# Patient Record
Sex: Male | Born: 1980 | Race: White | Hispanic: Yes | Smoking: Never smoker
Health system: Southern US, Community
[De-identification: ages and names within clinical notes are randomized; demographics above are authoritative.]

## PROBLEM LIST (undated history)

## (undated) ENCOUNTER — Emergency Department (HOSPITAL_COMMUNITY): Payer: Self-pay

## (undated) DIAGNOSIS — E119 Type 2 diabetes mellitus without complications: Secondary | ICD-10-CM

---

## 2007-12-30 ENCOUNTER — Emergency Department (HOSPITAL_COMMUNITY): Admission: EM | Admit: 2007-12-30 | Discharge: 2007-12-30 | Payer: Self-pay | Admitting: Emergency Medicine

## 2008-12-04 IMAGING — CT CT ABDOMEN W/ CM
2 of 5 series · 17 of 46 positions shown, 19 images · IV contrast (omni 300/water & 100 ML OMNI 300)
Comparison: None

CT ABDOMEN

CLINICAL DATA: DIARRHEA.  ABDOMINAL PAIN.  INCREASED WHITE BLOOD
CELL COUNT.

CT ABDOMEN AND PELVIS WITH CONTRAST
TECHNIQUE: Multidetector CT imaging of the abdomen and pelvis was
performed following the standard protocol following the bolus
administration of intravenous contrast.
Contrast: 100 ml [id number]

[Series 2: routine abdomen · axial · 0.74mm/px · z∈[-450,-30]mm · 14 of 94 slices shown, 16 images]
[im 6/94  soft-tissue]
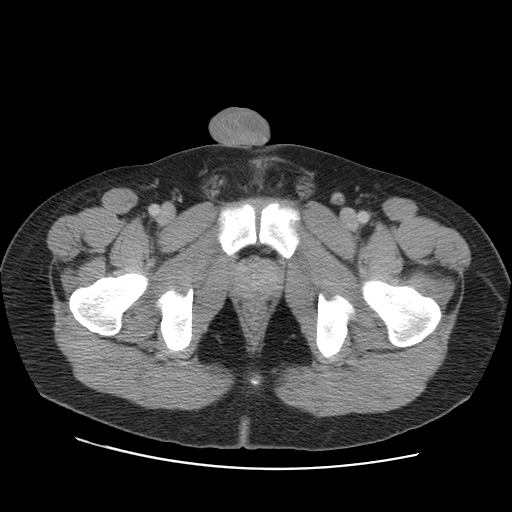
[im 6/94  bone]
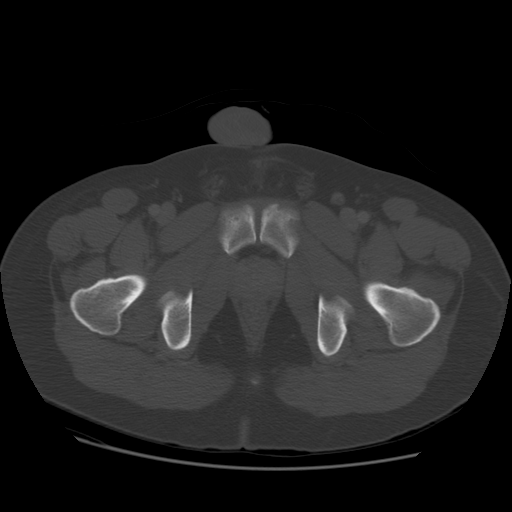
[im 11/94  soft-tissue]
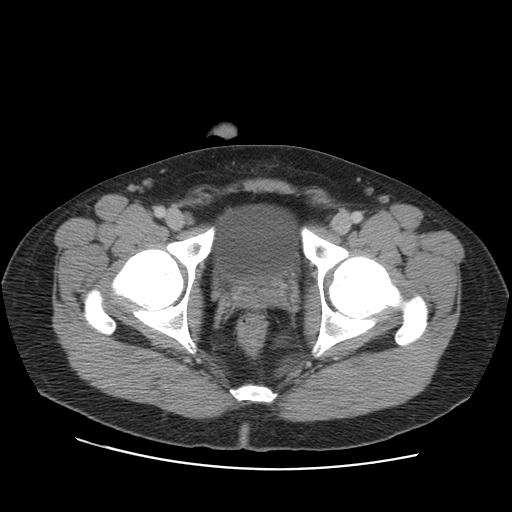
[im 21/94  soft-tissue]
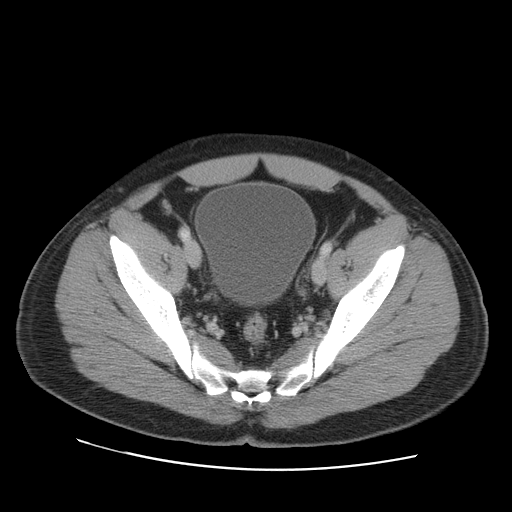
[im 26/94  soft-tissue]
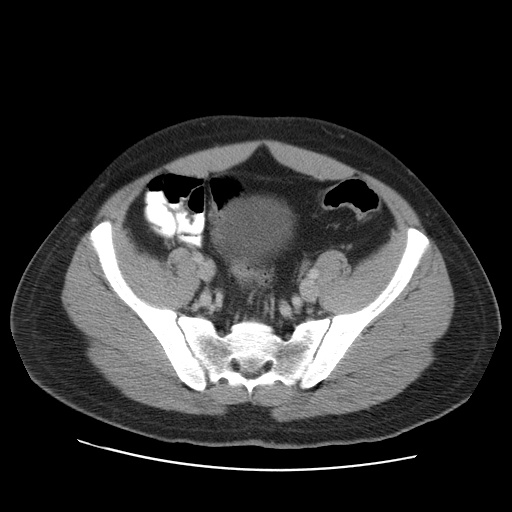
[im 32/94  soft-tissue]
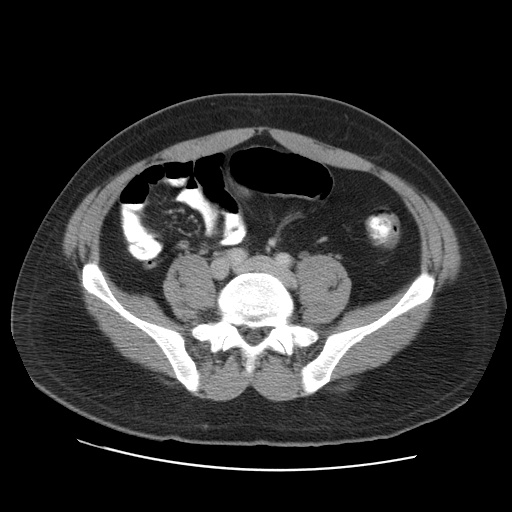
[im 37/94  soft-tissue]
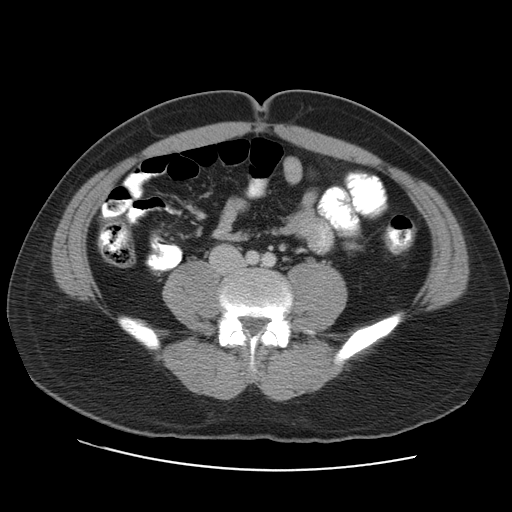
[im 42/94  soft-tissue]
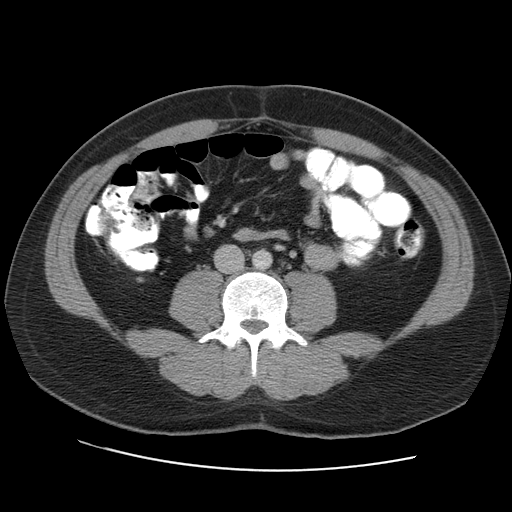
[im 52/94  soft-tissue]
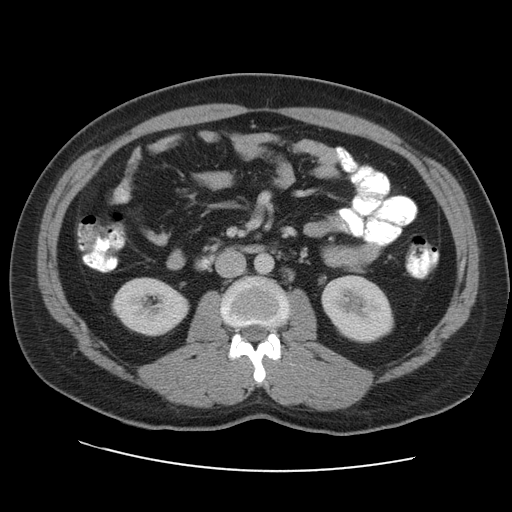
[im 57/94  soft-tissue]
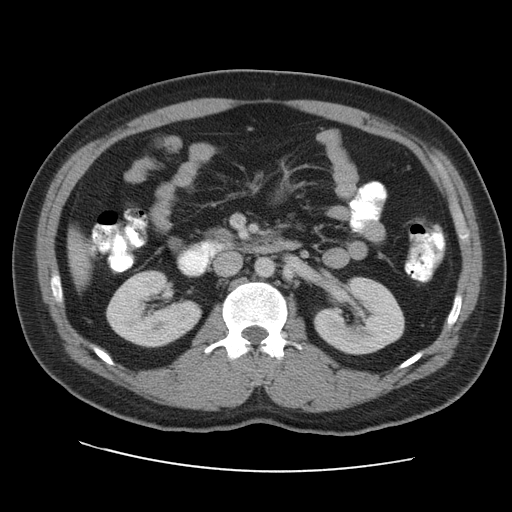
[im 57/94  bone]
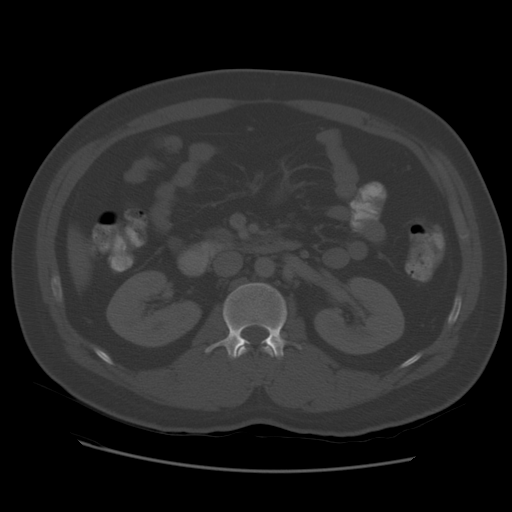
[im 63/94  soft-tissue]
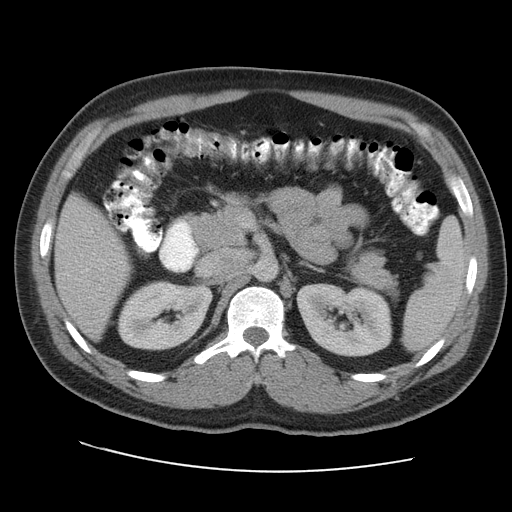
[im 68/94  soft-tissue]
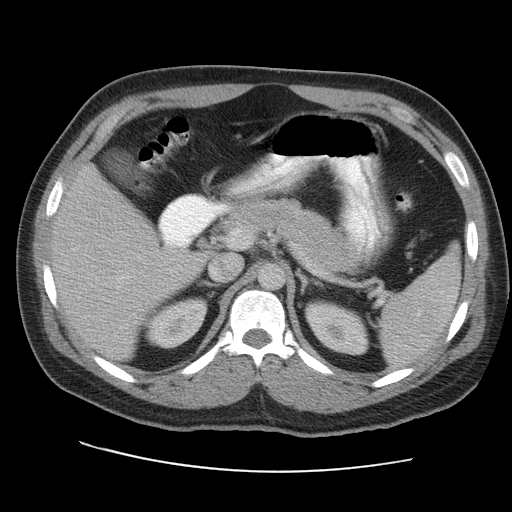
[im 73/94  soft-tissue]
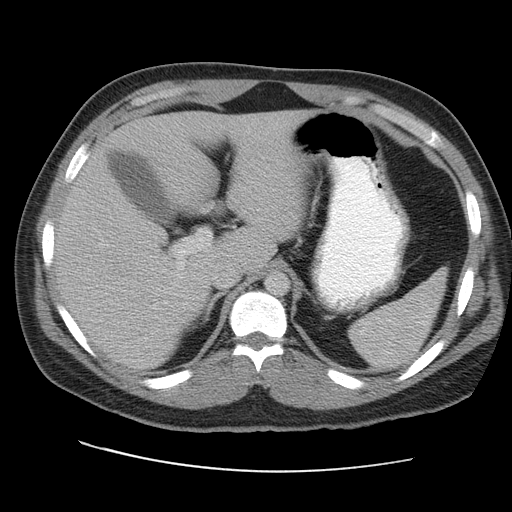
[im 83/94  soft-tissue]
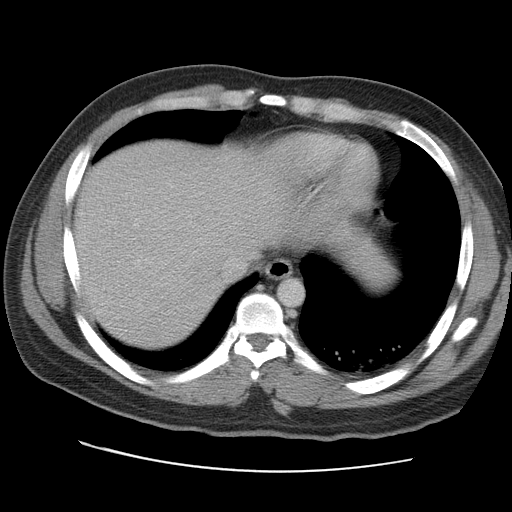
[im 88/94  soft-tissue]
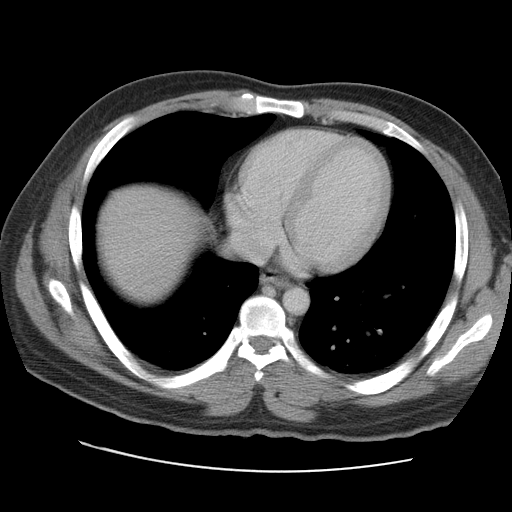

[Series 400: cor abd · coronal · 0.90mm/px · 3 of 134 slices shown]
[im 45/134  soft-tissue]
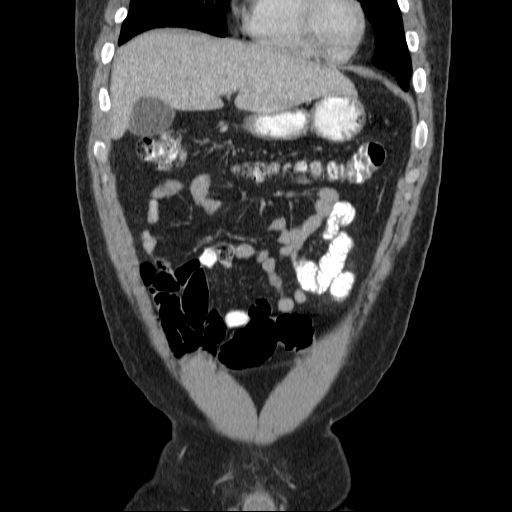
[im 60/134  soft-tissue]
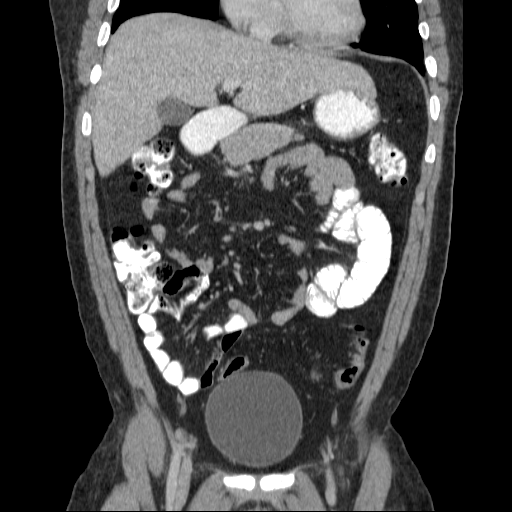
[im 74/134  soft-tissue]
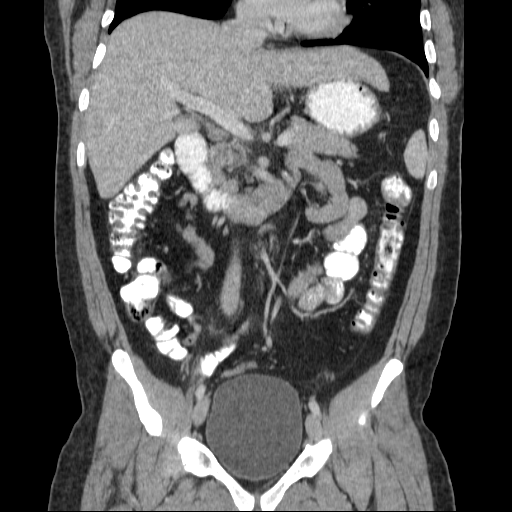

[17 of 46 positions shown; findings below may reference images not displayed]

FINDINGS: Mild bibasilar atelectasis. Normal heart size without
pericardial or pleural effusion.  Normal liver, spleen, stomach,
pancreas, gallbladder, biliary tree, adrenal glands.

Normal kidneys. No retroperitoneal or retrocrural adenopathy.
Normal colon, appendix, and terminal ileum.

Appendix normal on image 64. Normal abdominal small bowel without
ascites.
IMPRESSION: 1. No acute abdominal process.

CT PELVIS
FINDINGS: Normal pelvic bowel loops.  No pelvic adenopathy
ascites.  Normal urinary bladder and prostate. No acute osseous
abnormality.
IMPRESSION: 1. No acute pelvic process.

## 2011-02-26 LAB — OCCULT BLOOD X 1 CARD TO LAB, STOOL: Fecal Occult Bld: POSITIVE

## 2011-02-26 LAB — DIFFERENTIAL
Basophils Absolute: 0
Basophils Relative: 0
Lymphocytes Relative: 7 — ABNORMAL LOW
Neutro Abs: 9.8 — ABNORMAL HIGH
Neutrophils Relative %: 89 — ABNORMAL HIGH

## 2011-02-26 LAB — COMPREHENSIVE METABOLIC PANEL
Alkaline Phosphatase: 71
BUN: 12
Chloride: 106
Creatinine, Ser: 0.72
GFR calc non Af Amer: >60
Glucose, Bld: 134 — ABNORMAL HIGH
Potassium: 3.8
Total Bilirubin: 0.7

## 2011-02-26 LAB — CBC
HCT: 42.5
Hemoglobin: 14.4
MCV: 85.4
WBC: 11 — ABNORMAL HIGH

## 2011-02-26 LAB — LIPASE, BLOOD: Lipase: 23

## 2011-02-26 LAB — URINALYSIS, ROUTINE W REFLEX MICROSCOPIC
Bilirubin Urine: NEGATIVE
Glucose, UA: NEGATIVE
Hgb urine dipstick: NEGATIVE
Protein, ur: NEGATIVE
Urobilinogen, UA: 0.2

## 2022-06-27 ENCOUNTER — Emergency Department (HOSPITAL_COMMUNITY)
Admission: EM | Admit: 2022-06-27 | Discharge: 2022-06-29 | Disposition: A | Discharge status: Home / Self Care | Payer: Federal, State, Local not specified - Other | Attending: Emergency Medicine | Admitting: Emergency Medicine

## 2022-06-27 ENCOUNTER — Encounter (HOSPITAL_COMMUNITY): Payer: Self-pay

## 2022-06-27 ENCOUNTER — Other Ambulatory Visit: Payer: Self-pay

## 2022-06-27 DIAGNOSIS — F22 Delusional disorders: Secondary | ICD-10-CM

## 2022-06-27 DIAGNOSIS — Z1152 Encounter for screening for COVID-19: Secondary | ICD-10-CM | POA: Insufficient documentation

## 2022-06-27 LAB — ACETAMINOPHEN LEVEL: Acetaminophen (Tylenol), Serum: <10 ug/mL — ABNORMAL LOW (ref 10–30)

## 2022-06-27 LAB — BASIC METABOLIC PANEL
Anion gap: 11 (ref 5–15)
BUN: 17 mg/dL (ref 6–20)
CO2: 24 mmol/L (ref 22–32)
Calcium: 9.1 mg/dL (ref 8.9–10.3)
Chloride: 101 mmol/L (ref 98–111)
Creatinine, Ser: 0.78 mg/dL (ref 0.61–1.24)
GFR, Estimated: >60 mL/min (ref >60)
Glucose, Bld: 173 mg/dL — ABNORMAL HIGH (ref 70–99)
Potassium: 3.5 mmol/L (ref 3.5–5.1)
Sodium: 136 mmol/L (ref 135–145)

## 2022-06-27 LAB — CBC WITH DIFFERENTIAL/PLATELET
Abs Immature Granulocytes: 0.05 10*3/uL (ref 0.00–0.07)
Basophils Absolute: 0 10*3/uL (ref 0.0–0.1)
Basophils Relative: 0 %
Eosinophils Absolute: 0 10*3/uL (ref 0.0–0.5)
Eosinophils Relative: 0 %
HCT: 41.9 % (ref 39.0–52.0)
Hemoglobin: 13.8 g/dL (ref 13.0–17.0)
Immature Granulocytes: 1 %
Lymphocytes Relative: 5 %
Lymphs Abs: 0.5 10*3/uL — ABNORMAL LOW (ref 0.7–4.0)
MCH: 27.8 pg (ref 26.0–34.0)
MCHC: 32.9 g/dL (ref 30.0–36.0)
MCV: 84.3 fL (ref 80.0–100.0)
Monocytes Absolute: 0.3 10*3/uL (ref 0.1–1.0)
Monocytes Relative: 3 %
Neutro Abs: 9.4 10*3/uL — ABNORMAL HIGH (ref 1.7–7.7)
Neutrophils Relative %: 91 %
Platelets: 229 10*3/uL (ref 150–400)
RBC: 4.97 MIL/uL (ref 4.22–5.81)
RDW: 13.5 % (ref 11.5–15.5)
WBC: 10.3 10*3/uL (ref 4.0–10.5)
nRBC: 0 % (ref 0.0–0.2)

## 2022-06-27 LAB — SALICYLATE LEVEL: Salicylate Lvl: <7 mg/dL — ABNORMAL LOW (ref 7.0–30.0)

## 2022-06-27 LAB — ETHANOL: Alcohol, Ethyl (B): <10 mg/dL (ref <10)
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
## 2022-06-28 DIAGNOSIS — F22 Delusional disorders: Secondary | ICD-10-CM

## 2022-06-28 LAB — RAPID URINE DRUG SCREEN, HOSP PERFORMED
Amphetamines: NOT DETECTED
Barbiturates: NOT DETECTED
Benzodiazepines: NOT DETECTED
Cocaine: POSITIVE — AB
Opiates: NOT DETECTED
Tetrahydrocannabinol: NOT DETECTED

## 2022-06-28 MED ORDER — OLANZAPINE 5 MG PO TABS
5.0000 mg | ORAL_TABLET | Freq: Every day | ORAL | Status: DC
  Administered 2022-06-28 – 2022-06-29 (×2): 5 mg via ORAL
  Filled 2022-06-28 (×2): qty 1

## 2022-06-28 NOTE — Progress Notes (Signed)
This CSW requested [place] Wynonia Hazard, RN to review pt for [facility] at 9:58pm. Disposition team to follow up in the morning.   Benjaman Kindler, MSW, [facility] 06/28/2022 11:52 PM
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
## 2022-06-28 NOTE — ED Notes (Signed)
Pt. Dressed out in [employer] Belonging including clothes and phone stored in cabinet behind nurses station labeled 16-18.
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
## 2022-06-29 ENCOUNTER — Encounter (HOSPITAL_COMMUNITY): Payer: Self-pay | Admitting: Nurse Practitioner

## 2022-06-29 ENCOUNTER — Other Ambulatory Visit: Payer: Self-pay

## 2022-06-29 ENCOUNTER — Inpatient Hospital Stay (HOSPITAL_COMMUNITY)
Admission: AD | Admit: 2022-06-29 | Discharge: 2022-07-06 | DRG: 885 | Disposition: A | Discharge status: Home / Self Care | Payer: Federal, State, Local not specified - Other | Source: Intra-hospital | Attending: Psychiatry | Admitting: Psychiatry

## 2022-06-29 DIAGNOSIS — Z7151 Drug abuse counseling and surveillance of drug abuser: Secondary | ICD-10-CM | POA: Diagnosis not present

## 2022-06-29 DIAGNOSIS — F5101 Primary insomnia: Principal | ICD-10-CM

## 2022-06-29 DIAGNOSIS — E119 Type 2 diabetes mellitus without complications: Secondary | ICD-10-CM | POA: Diagnosis present

## 2022-06-29 DIAGNOSIS — F515 Nightmare disorder: Secondary | ICD-10-CM | POA: Diagnosis present

## 2022-06-29 DIAGNOSIS — Z9152 Personal history of nonsuicidal self-harm: Secondary | ICD-10-CM | POA: Diagnosis not present

## 2022-06-29 DIAGNOSIS — F411 Generalized anxiety disorder: Secondary | ICD-10-CM | POA: Diagnosis present

## 2022-06-29 DIAGNOSIS — F22 Delusional disorders: Secondary | ICD-10-CM | POA: Diagnosis not present

## 2022-06-29 DIAGNOSIS — F1420 Cocaine dependence, uncomplicated: Secondary | ICD-10-CM | POA: Diagnosis present

## 2022-06-29 DIAGNOSIS — F323 Major depressive disorder, single episode, severe with psychotic features: Secondary | ICD-10-CM | POA: Diagnosis present

## 2022-06-29 DIAGNOSIS — F410 Panic disorder [episodic paroxysmal anxiety] without agoraphobia: Secondary | ICD-10-CM | POA: Diagnosis present

## 2022-06-29 DIAGNOSIS — G4700 Insomnia, unspecified: Secondary | ICD-10-CM | POA: Diagnosis present

## 2022-06-29 DIAGNOSIS — R45851 Suicidal ideations: Secondary | ICD-10-CM | POA: Diagnosis present

## 2022-06-29 DIAGNOSIS — J302 Other seasonal allergic rhinitis: Secondary | ICD-10-CM | POA: Diagnosis present

## 2022-06-29 DIAGNOSIS — Z1152 Encounter for screening for COVID-19: Secondary | ICD-10-CM

## 2022-06-29 DIAGNOSIS — Z79899 Other long term (current) drug therapy: Secondary | ICD-10-CM | POA: Diagnosis not present

## 2022-06-29 LAB — RESP PANEL BY RT-PCR (RSV, FLU A&B, COVID)  RVPGX2
Influenza A by PCR: NEGATIVE
Influenza B by PCR: NEGATIVE
Resp Syncytial Virus by PCR: NEGATIVE
SARS Coronavirus 2 by RT PCR: NEGATIVE

## 2022-06-29 MED ORDER — ACETAMINOPHEN 325 MG PO TABS
650.0000 mg | ORAL_TABLET | Freq: Four times a day (QID) | ORAL | Status: DC | PRN
  Administered 2022-06-30 – 2022-07-05 (×2): 650 mg via ORAL
  Filled 2022-06-29 (×2): qty 2

## 2022-06-29 MED ORDER — OLANZAPINE 5 MG PO TABS
5.0000 mg | ORAL_TABLET | Freq: Every day | ORAL | Status: DC
  Filled 2022-06-29 (×2): qty 1

## 2022-06-29 MED ORDER — ALUM & MAG HYDROXIDE-SIMETH 200-200-20 MG/5ML PO SUSP: 30.0000 mL | ORAL | Status: DC | PRN

## 2022-06-29 MED ORDER — MAGNESIUM HYDROXIDE 400 MG/5ML PO SUSP: 30.0000 mL | Freq: Every day | ORAL | Status: DC | PRN

## 2022-06-29 NOTE — Progress Notes (Signed)
   06/29/22 2300  Psych Admission Type (Psych Patients Only)  Admission Status Voluntary  Psychosocial Assessment  Patient Complaints Substance abuse;Other (Comment) (paranoia)  Eye Contact Brief  Facial Expression Flat  Affect Anxious  Speech Logical/coherent  Interaction Assertive  Motor Activity Slow  Appearance/Hygiene In scrubs  Behavior Characteristics Fidgety;Cooperative  Mood Preoccupied  Aggressive Behavior  Effect No apparent injury  Thought Process  Coherency WDL  Content Paranoia  Delusions Paranoid  Perception Hallucinations  Hallucination Auditory  Judgment Poor  Confusion None  Danger to Self  Current suicidal ideation? Denies  Danger to Others  Danger to Others None reported or observed

## 2022-06-29 NOTE — ED Provider Notes (Signed)
Emergency Medicine Observation Re-evaluation Note  Dennis Black is a 42 y.o. male, seen on rounds today.  Pt initially presented to the ED for complaints of Paranoid Currently, the patient is resting.  Physical Exam  BP 93/65 (BP Location: Right Arm)   Pulse 65   Temp 97.9 F (36.6 C) (Oral)   Resp 16   SpO2 98%  Physical Exam  ED Course / MDM  EKG:EKG Interpretation  Date/Time:  Sunday June 27 2022 21:03:00 EST Ventricular Rate:  109 PR Interval:  157 QRS Duration: 104 QT Interval:  356 QTC Calculation: 480 R Axis:   98 Text Interpretation: Sinus tachycardia Borderline right axis deviation Nonspecific T abnormalities, inferior leads Confirmed by Dene Gentry [id number]) on 06/27/2022 9:18:55 PM  I have reviewed the labs performed to date as well as medications administered while in observation.  Recent changes in the last 24 hours include none.  Plan  Current plan is for placement by psychiatry.    Lacretia Leigh, MD 06/29/22 [phone]

## 2022-06-29 NOTE — Progress Notes (Addendum)
Pt was accepted to [facility] 06/29/22; Bed Assignment 507-1 PENDING COVID PCR, Voluntary consent faxed to [phone]  Per April Wilson, Paramedic vol consent has been faxed and per [facility] vol consent has been received.   Pt meets inpatient criteria per Charmaine Downs, NP  Attending Physician will be Dr. Janine Limbo  Report can be called to: -Adult unit: [phone]  Pt can arrive after 2100  Care Team Notified: [facility] Lynnda Shields, RN, Charmaine Downs, NP, April Wilson, Paramedic, Night [facility] Wynonia Hazard, RN, [place], [place], [place] 06/29/2022 @ 6:46 PM

## 2022-06-29 NOTE — Progress Notes (Signed)
LCSW Progress Note  [id number]   Dennis Black  06/29/2022  2:17 PM  Description:   Inpatient Psychiatric Referral  Patient was recommended inpatient per  Michaele Offer, NP. There are no available beds at [facility] Patient was referred to the following facilities:   Destination  Service Provider Address Phone Fax  [address] [place] [address] [phone]  [facility] [place]  [place], [place] [address] [phone]  [place], [place] [address] [phone]  [address] [place] [address] [phone]  [facility]  [address] [place] [address] [facility]  [address] [place] [address] [phone]  [facility]  [address] [place] [address] [phone] [facility]  [address] [place] [address] [phone] [facility]  [address], [place] [address] [place]  [address] [place] [address] [phone] [facility]  [address], [place] [address] [phone]  [facility]  [address], [place] [address] [phone]  [facility]  [address], [place] [address] [phone]  [facility]  [address] [place] [address] [place]  [facility]  [address] [place] [address] [phone]  [place]  [address], [place] [address]  [phone]  [address] [place] [address] [phone]  [facility]  [address] [place] [address] [phone] [place], [place] [address] [place]  [address] [place] [address] [phone]  [facility]  [place], [place] [address] [phone]  [facility] [address] [place] [address] [phone]  [facility]  [address] [place] [address] [phone] [facility]  [place], [place] [address] [phone]      Situation ongoing, CSW to continue following and update chart as more information becomes available.      Denna Haggard, [place]  06/29/2022 2:17 PM

## 2022-06-29 NOTE — ED Notes (Signed)
Pt's breakfast tray has arrived, pt awoken from sleeping, pt sitting up and eating their breakfast now.

## 2022-06-29 NOTE — ED Notes (Signed)
Pt's lunch has arrived, left near bed. Pt still on the phonecall.

## 2022-06-29 NOTE — ED Notes (Signed)
Pt's wife visiting pt.

## 2022-06-29 NOTE — ED Provider Notes (Signed)
  Physical Exam  BP 119/76   Pulse 74   Temp 98.4 F (36.9 C) (Oral)   Resp 14   SpO2 98%   Physical Exam  Procedures  Procedures  ED Course / MDM    Medical Decision Making Amount and/or Complexity of Data Reviewed Labs: ordered.   Patient reportedly sent to [facility].  I changed disposition to admitted to [facility], MD 06/29/22 2222

## 2022-06-29 NOTE — ED Notes (Signed)
Step son, Vilinda Flake, called to speak to the pt.

## 2022-06-29 NOTE — ED Notes (Signed)
Pt's step daughter visiting pt at this time

## 2022-06-29 NOTE — ED Notes (Signed)
Pt's dinner has arrived, pt sitting up and eating his dinner now 

## 2022-06-29 NOTE — ED Notes (Signed)
Pt covid swab complete.  Pt's signed Vol Admin and Consent to Treat signed and faxed over to [facility].  Pt's Buyer, retail for transport signed and faxed over to [facility].

## 2022-06-29 NOTE — Progress Notes (Signed)
   Initial Treatment Plan Date: 06/29/2022 10:00 PM Khori Rosevear  MRN: [id number]       PATIENT STRESSORS: Substance abuse   Housing difficulties      PATIENT STRENGTHS: Communication skills  Family support      PATIENT IDENTIFIED PROBLEMS: Paranoia  Substance use   States "people in my house want to kill me"  States "everybody is out to get me"                      DISCHARGE CRITERIA:  Ability to meet basic life and health needs Improved stabilization in mood, thinking, and/or behavior Verbal commitment to abstaining from drug abuse Commitment to medication compliance     PRELIMINARY DISCHARGE PLAN: Outpatient therapy Communicate with family support about safe living arrangements    PATIENT/FAMILY INVOLVEMENT: This treatment plan has been presented to and reviewed with the patient, Archit Leger, The patient has been given the opportunity to ask questions and make suggestions.   [place] M. Smith Robert, RN 06/29/2022 11:08 PM

## 2022-06-29 NOTE — Progress Notes (Signed)
ADMISSION NOTE:   Dennis Black is a 42 year old Hispanic male that was brought into [facility] by PD with the c/o "people are out to get me." Per ED note, patient was "seen running down the street yelling that people are out to get him." Patient is alert and oriented to time, situation, person, and place. Patient denies SI, HI, and VH. He reports auditory hallucinations that tell him, "people are out to get me." He also states that he started to believe the voices, due to his beliefs that "people that he allowed to stay in his apartment are trying to kill him." Patient denies voice commands. Patient admits to using cocaine frequently. Patient contracts for safety at this time. Patient oriented to the unit and given nourishments. Patient made aware about q15 checks. Patient denies having any questions at this time.

## 2022-06-29 NOTE — Progress Notes (Signed)
[facility] Psych ED Progress Note  06/29/2022 4:25 PM Dennis Black  MRN:  [id number]   Subjective: Assessment was carried out via on [employer]. Spanish speaking male brought in for Paranoia running down the street stating people are out to get him.  Patient also endorses Hallucination-Auditory hallucination.   He hears voices telling him things including telling him that people are out to get.  He remains paranoid especially in a crowded area stating people are talking about him. Patient also admitted that he uses Cocaine on Weekends and that he was feeling paranoid and hearing voices before he started using Cocaine.  Patient reports getting fours hours of sleep at night and appetite varies day to day. Today patient states nothing has changed since he came in.  He is still hearing voices while in the ER room and still feels paranoid that people are talking about him.  Patient is already taking Olanzapine 5 mg at bed time. Patient continues to meet criteria for inpatient Mental health treatment and stabilization.  We discussed the need to abstain from using Cocaine which patient is interested in.  He also states his fears is real because he brought unknown people to his apartment and now he feels they do not want to leave and they want to kill him.  He denies SI/HI/VH. Principal Problem: Paranoia (psychosis) ([place]) Diagnosis:  Principal Problem:   Paranoia (psychosis) [facility])   ED Assessment Time Calculation: Start Time: [id number] Stop Time: 1558 Total Time in Minutes (Assessment Completion): 17   Past Psychiatric History: Denies  [place] Scale:  [place] ED from 06/27/2022 in [facility] [place] Error: Question 6 not populated       Past Medical History: History reviewed. No pertinent past medical history.  Family History: History reviewed. No pertinent family history. Family Psychiatric  History: denies Social History:   Social History   Substance and Sexual Activity  Alcohol Use None     Social History   Substance and Sexual Activity  Drug Use Not on file    Social History   Socioeconomic History   Marital status: Unknown    Spouse name: Not on file   Number of children: Not on file   Years of education: Not on file   Highest education level: Not on file  Occupational History   Not on file  Tobacco Use   Smoking status: Not on file   Smokeless tobacco: Not on file  Substance and Sexual Activity   Alcohol use: Not on file   Drug use: Not on file   Sexual activity: Not on file  Other Topics Concern   Not on file  Social History Narrative   Not on file   Social Determinants of Health   Financial Resource Strain: Not on file  Food Insecurity: Not on file  Transportation Needs: Not on file  Physical Activity: Not on file  Stress: Not on file  Social Connections: Not on file    Sleep: Fair  Appetite:  Fair  Current Medications: Current Facility-Administered Medications  Medication Dose Route Frequency Provider Last Rate Last Admin   OLANZapine (ZYPREXA) tablet 5 mg  5 mg Oral QHS Motley-Mangrum, Jadeka A, PMHNP   5 mg at 06/28/22 2231   No current outpatient medications on file.    Lab Results:  Results for orders placed or performed during the hospital encounter of 06/27/22 (from the past 48 hour(s))  CBC with Differential  Status: Abnormal   Collection Time: 06/27/22 10:03 PM  Result Value Ref Range   WBC 10.3 4.0 - 10.5 K/uL   RBC 4.97 4.22 - 5.81 MIL/uL   Hemoglobin 13.8 13.0 - 17.0 g/dL   HCT 41.9 39.0 - 52.0 %   MCV 84.3 80.0 - 100.0 fL   MCH 27.8 26.0 - 34.0 pg   MCHC 32.9 30.0 - 36.0 g/dL   RDW 13.5 11.5 - 15.5 %   Platelets 229 150 - 400 K/uL   nRBC 0.0 0.0 - 0.2 %   Neutrophils Relative % 91 %   Neutro Abs 9.4 (H) 1.7 - 7.7 K/uL   Lymphocytes Relative 5 %   Lymphs Abs 0.5 (L) 0.7 - 4.0 K/uL   Monocytes Relative 3 %   Monocytes Absolute 0.3 0.1 - 1.0  K/uL   Eosinophils Relative 0 %   Eosinophils Absolute 0.0 0.0 - 0.5 K/uL   Basophils Relative 0 %   Basophils Absolute 0.0 0.0 - 0.1 K/uL   Immature Granulocytes 1 %   Abs Immature Granulocytes 0.05 0.00 - 0.07 K/uL    Comment: Performed at [facility], [place] [address] [place], [place] [address]  Ethanol     Status: None   Collection Time: 06/27/22 10:03 PM  Result Value Ref Range   Alcohol, Ethyl (B) <10 <10 mg/dL    Comment: (NOTE) Lowest detectable limit for serum alcohol is 10 mg/dL.  For medical purposes only. Performed at [facility], [place] [address], [place], [place] [id number]   Basic metabolic panel     Status: Abnormal   Collection Time: 06/27/22 10:03 PM  Result Value Ref Range   Sodium 136 135 - 145 mmol/L   Potassium 3.5 3.5 - 5.1 mmol/L   Chloride 101 98 - 111 mmol/L   CO2 24 22 - 32 mmol/L   Glucose, Bld 173 (H) 70 - 99 mg/dL    Comment: Glucose reference range applies only to samples taken after fasting for at least 8 hours.   BUN 17 6 - 20 mg/dL   Creatinine, Ser 0.78 0.61 - 1.24 mg/dL   Calcium 9.1 8.9 - 10.3 mg/dL   GFR, Estimated >60 >60 mL/min    Comment: (NOTE) Calculated using the CKD-EPI Creatinine Equation (2021)    Anion gap 11 5 - 15    Comment: Performed at [facility], [place] [address], [place], [place] [id number]  Salicylate level     Status: Abnormal   Collection Time: 06/27/22 10:03 PM  Result Value Ref Range   Salicylate Lvl Q000111Q (L) 7.0 - 30.0 mg/dL    Comment: Performed at [facility], [place] [address], [place], [place] [address]  Acetaminophen level     Status: Abnormal   Collection Time: 06/27/22 10:03 PM  Result Value Ref Range   Acetaminophen (Tylenol), Serum <10 (L) 10 - 30 ug/mL    Comment: (NOTE) Therapeutic concentrations vary significantly. A range of 10-30 ug/mL  may be an effective concentration for many patients. However, some  are best treated at  concentrations outside of this range. Acetaminophen concentrations >150 ug/mL at 4 hours after ingestion  and >50 ug/mL at 12 hours after ingestion are often associated with  toxic reactions.  Performed at [facility], [place] [address], [place], [place] [address]   Urine rapid drug screen (hosp performed)     Status: Abnormal   Collection Time: 06/27/22 11:57 PM  Result Value Ref  Range   Opiates NONE DETECTED NONE DETECTED   Cocaine POSITIVE (A) NONE DETECTED   Benzodiazepines NONE DETECTED NONE DETECTED   Amphetamines NONE DETECTED NONE DETECTED   Tetrahydrocannabinol NONE DETECTED NONE DETECTED   Barbiturates NONE DETECTED NONE DETECTED    Comment: (NOTE) DRUG SCREEN FOR MEDICAL PURPOSES ONLY.  IF CONFIRMATION IS NEEDED FOR ANY PURPOSE, NOTIFY LAB WITHIN 5 DAYS.  LOWEST DETECTABLE LIMITS FOR URINE DRUG SCREEN Drug Class                     Cutoff (ng/mL) Amphetamine and metabolites    1000 Barbiturate and metabolites    200 Benzodiazepine                 200 Opiates and metabolites        300 Cocaine and metabolites        300 THC                            50 Performed at [facility], [place] [address] [place], [place] [address]     Blood Alcohol level:  Lab Results  Component Value Date   ETH <10 06/27/2022    Physical Findings:  CIWA:    COWS:     Musculoskeletal: Strength & Muscle Tone: within normal limits Gait & Station: normal Patient leans: Front  Psychiatric Specialty Exam:  Presentation  General Appearance:  Casual  Eye Contact: Good  Speech: Clear and Coherent; Normal Rate  Speech Volume: Normal  Handedness: Right   Mood and Affect  Mood: Anxious  Affect: Congruent   Thought Process  Thought Processes: Coherent; Goal Directed  Descriptions of Associations:Intact  Orientation:Full (Time, Place and Person)  Thought Content:Logical; Paranoid Ideation  History of  Schizophrenia/Schizoaffective disorder:No  Duration of Psychotic Symptoms:N/A  Hallucinations:Hallucinations: Auditory  Ideas of Reference:Paranoia  Suicidal Thoughts:Suicidal Thoughts: No  Homicidal Thoughts:Homicidal Thoughts: No   Sensorium  Memory: Immediate Good; Recent Good; Remote Good  Judgment: Good  Insight: Good   Executive Functions  Concentration: Good  Attention Span: Good  Recall: Good  Fund of Knowledge: Good  Language: Good   Psychomotor Activity  Psychomotor Activity: Psychomotor Activity: Normal   Assets  Assets: Communication Skills; Desire for Improvement; Housing; Physical Health   Sleep  Sleep: Sleep: Good    Physical Exam: Physical Exam Vitals and nursing note reviewed.  Constitutional:      Appearance: Normal appearance.  HENT:     Head: Normocephalic and atraumatic.     Nose: Nose normal.  Cardiovascular:     Rate and Rhythm: Normal rate and regular rhythm.  Pulmonary:     Effort: Pulmonary effort is normal.  Musculoskeletal:        General: Normal range of motion.     Cervical back: Normal range of motion.  Skin:    General: Skin is warm and dry.  Neurological:     Mental Status: He is alert and oriented to person, place, and time.    Review of Systems  Constitutional: Negative.   HENT: Negative.    Eyes: Negative.   Respiratory: Negative.    Cardiovascular: Negative.   Gastrointestinal: Negative.   Genitourinary: Negative.   Musculoskeletal: Negative.   Skin: Negative.   Neurological: Negative.   Endo/Heme/Allergies: Negative.   Psychiatric/Behavioral:  Positive for hallucinations and substance abuse. The patient is nervous/anxious.    Blood pressure (!) 104/57, pulse 78, temperature 98.3 F (  36.8 C), temperature source Oral, resp. rate 16, SpO2 90 %. There is no height or weight on file to calculate BMI.   Medical Decision Making: Patient remains Paranoid and still hallucinating  and  continues to need inpatient Psychiatry hospitalization.  Currently patient is taking Olanzapine 5 mg every night.  Problem 1: Paranoia Disposition: Recommend psychiatric Inpatient admission when medically cleared.  Delfin Gant, NP-PMHNP-BC 06/29/2022, 4:25 PM

## 2022-06-30 ENCOUNTER — Encounter (HOSPITAL_COMMUNITY): Payer: Self-pay

## 2022-06-30 DIAGNOSIS — F22 Delusional disorders: Secondary | ICD-10-CM

## 2022-06-30 DIAGNOSIS — F515 Nightmare disorder: Secondary | ICD-10-CM | POA: Diagnosis present

## 2022-06-30 DIAGNOSIS — G4700 Insomnia, unspecified: Secondary | ICD-10-CM | POA: Diagnosis present

## 2022-06-30 DIAGNOSIS — F323 Major depressive disorder, single episode, severe with psychotic features: Secondary | ICD-10-CM | POA: Diagnosis present

## 2022-06-30 DIAGNOSIS — F1420 Cocaine dependence, uncomplicated: Secondary | ICD-10-CM | POA: Diagnosis present

## 2022-06-30 DIAGNOSIS — F411 Generalized anxiety disorder: Secondary | ICD-10-CM | POA: Diagnosis present

## 2022-06-30 LAB — URINALYSIS, ROUTINE W REFLEX MICROSCOPIC
Bacteria, UA: NONE SEEN
Bilirubin Urine: NEGATIVE
Glucose, UA: NEGATIVE mg/dL
Hgb urine dipstick: NEGATIVE
Ketones, ur: NEGATIVE mg/dL
Leukocytes,Ua: NEGATIVE
Nitrite: NEGATIVE
Protein, ur: NEGATIVE mg/dL
Specific Gravity, Urine: 1.033 — ABNORMAL HIGH (ref 1.005–1.030)
pH: 5 (ref 5.0–8.0)

## 2022-06-30 MED ORDER — PRAZOSIN HCL 1 MG PO CAPS
1.0000 mg | ORAL_CAPSULE | Freq: Every day | ORAL | Status: DC
  Administered 2022-06-30 – 2022-07-05 (×6): 1 mg via ORAL
  Filled 2022-06-30: qty 7
  Filled 2022-06-30 (×8): qty 1

## 2022-06-30 MED ORDER — RISPERIDONE 1 MG PO TABS
1.0000 mg | ORAL_TABLET | Freq: Two times a day (BID) | ORAL | Status: DC
  Administered 2022-06-30 – 2022-07-02 (×4): 1 mg via ORAL
  Filled 2022-06-30 (×8): qty 1

## 2022-06-30 MED ORDER — HYDROXYZINE HCL 25 MG PO TABS: 25.0000 mg | ORAL_TABLET | Freq: Three times a day (TID) | ORAL | Status: DC | PRN

## 2022-06-30 MED ORDER — TRAZODONE HCL 50 MG PO TABS: 50.0000 mg | ORAL_TABLET | Freq: Every evening | ORAL | Status: DC | PRN

## 2022-06-30 MED ORDER — IBUPROFEN 400 MG PO TABS
400.0000 mg | ORAL_TABLET | ORAL | Status: AC | PRN
  Administered 2022-06-30: 400 mg via ORAL
  Filled 2022-06-30: qty 1

## 2022-06-30 MED ORDER — ESCITALOPRAM OXALATE 10 MG PO TABS
10.0000 mg | ORAL_TABLET | Freq: Every day | ORAL | Status: DC
  Administered 2022-07-01 – 2022-07-06 (×6): 10 mg via ORAL
  Filled 2022-06-30 (×3): qty 1
  Filled 2022-06-30: qty 7
  Filled 2022-06-30 (×5): qty 1

## 2022-06-30 NOTE — BHH Suicide Risk Assessment (Signed)
Suicide Risk Assessment  Admission Assessment    [facility] Admission Suicide Risk Assessment   Nursing information obtained from:    Demographic factors:  Male, Low socioeconomic status Current Mental Status:  NA Loss Factors:  NA Historical Factors:  NA Risk Reduction Factors:  Employed, Living with another person, especially a relative  Total Time spent with patient: 1.5 hours Principal Problem: Paranoia ([place]) Diagnosis:  Active Problems:   MDD (major depressive disorder), single episode, severe with psychosis ([place])   GAD (generalized anxiety disorder)   Nightmares   Insomnia   Cocaine dependence ([place])  Subjective Data:  "I lost control of myself, I started hallucinating, thinking people are trying to kill me".   Continued Clinical Symptoms: Depressive symptoms, psychosis, substance use specifically cocaine, suicidal ideation with plan to use a gun to shoot self, in need of continuous hospitalization for treatment and stabilization of symptoms.   The "Alcohol Use Disorders Identification Test", Guidelines for Use in Primary Care, Second Edition.  World Pharmacologist [facility]). Score between 0-7:  no or low risk or alcohol related problems. Score between 8-15:  moderate risk of alcohol related problems. Score between 16-19:  high risk of alcohol related problems. Score 20 or above:  warrants further diagnostic evaluation for alcohol dependence and treatment.  CLINICAL FACTORS:   Depression:   Anhedonia Hopelessness Impulsivity Insomnia Severe More than one psychiatric diagnosis Currently Psychotic  Musculoskeletal: Strength & Muscle Tone: within normal limits Gait & Station: normal Patient leans: N/A  Psychiatric Specialty Exam:  Presentation  General Appearance:  Disheveled  Eye Contact: Fair  Speech: Clear and Coherent  Speech Volume: Normal  Handedness: Right   Mood and Affect  Mood: Anxious; Depressed  Affect: Congruent   Thought Process   Thought Processes: Coherent  Descriptions of Associations:Intact  Orientation:Full (Time, Place and Person)  Thought Content:Logical  History of Schizophrenia/Schizoaffective disorder:No  Duration of Psychotic Symptoms:Less than six months  Hallucinations:Hallucinations: Auditory; Visual  Ideas of Reference:Paranoia; Delusions  Suicidal Thoughts:Suicidal Thoughts: No  Homicidal Thoughts:Homicidal Thoughts: No   Sensorium  Memory: Immediate Good  Judgment: Fair  Insight: Fair   Materials engineer: Fair  Attention Span: Fair  Recall: [place] of Knowledge: Fair  Language: Good   Psychomotor Activity  Psychomotor Activity: Psychomotor Activity: Normal   Assets  Assets: Communication Skills   Sleep  Sleep: Sleep: Poor  Physical Exam: Physical Exam Review of Systems  Psychiatric/Behavioral:  Positive for depression, hallucinations and substance abuse. Negative for memory loss and suicidal ideas. The patient is nervous/anxious and has insomnia.    Blood pressure 130/83, pulse 62, temperature 98.6 F (37 C), temperature source Oral, resp. rate 16, height 5\' 6"  (1.676 m), weight 90.3 kg, SpO2 98 %. Body mass index is 32.12 kg/m.   COGNITIVE FEATURES THAT CONTRIBUTE TO RISK:  None    SUICIDE RISK:   Moderate:  Frequent suicidal ideation with limited intensity, and duration, some specificity in terms of plans, no associated intent, good self-control, limited dysphoria/symptomatology, some risk factors present, and identifiable protective factors, including available and accessible social support.  PLAN OF CARE: See H & P  I certify that inpatient services furnished can reasonably be expected to improve the patient's condition.   Nicholes Rough, NP 06/30/2022, 3:31 PM

## 2022-06-30 NOTE — BHH Group Notes (Signed)
PsychoEducational Group Note- Patients were given education about how to identify signs of mental health decompensation, and identify healthy coping skills. Pts were given toy car and given analogy of brain function as it pertains to mental health.  Patient attended the group and participated. Interpreter Gigi helped patient to participate.

## 2022-06-30 NOTE — BH IP Treatment Plan (Signed)
Interdisciplinary Treatment and Diagnostic Plan Update  06/30/2022 Time of Session: 9:50am  Dennis Black MRN: [id number]  Principal Diagnosis: Paranoia [facility])  Secondary Diagnoses: Principal Problem:   Paranoia ([place])   Current Medications:  Current Facility-Administered Medications  Medication Dose Route Frequency Provider Last Rate Last Admin   acetaminophen (TYLENOL) tablet 650 mg  650 mg Oral Q6H PRN Charmaine Downs C, NP   650 mg at 06/30/22 0748   alum & mag hydroxide-simeth (MAALOX/MYLANTA) 200-200-20 MG/5ML suspension 30 mL  30 mL Oral Q4H PRN Onuoha, Josephine C, NP       ibuprofen (ADVIL) tablet 400 mg  400 mg Oral Q4H PRN Ranae Palms, MD       magnesium hydroxide (MILK OF MAGNESIA) suspension 30 mL  30 mL Oral Daily PRN Onuoha, Josephine C, NP       OLANZapine (ZYPREXA) tablet 5 mg  5 mg Oral QHS Onuoha, Josephine C, NP       PTA Medications: No medications prior to admission.    Patient Stressors:    Patient Strengths:    Treatment Modalities: Medication Management, Group therapy, Case management,  1 to 1 session with clinician, Psychoeducation, Recreational therapy.   Physician Treatment Plan for Primary Diagnosis: Paranoia ([place]) Long Term Goal(s):     Short Term Goals:    Medication Management: Evaluate patient's response, side effects, and tolerance of medication regimen.  Therapeutic Interventions: 1 to 1 sessions, Unit Group sessions and Medication administration.  Evaluation of Outcomes: Not Met  Physician Treatment Plan for Secondary Diagnosis: Principal Problem:   Paranoia ([place])  Long Term Goal(s):     Short Term Goals:       Medication Management: Evaluate patient's response, side effects, and tolerance of medication regimen.  Therapeutic Interventions: 1 to 1 sessions, Unit Group sessions and Medication administration.  Evaluation of Outcomes: Not Met   RN Treatment Plan for Primary Diagnosis: Paranoia ([place]) Long Term Goal(s):  Knowledge of disease and therapeutic regimen to maintain health will improve  Short Term Goals: Ability to remain free from injury will improve, Ability to participate in decision making will improve, Ability to verbalize feelings will improve, Ability to disclose and discuss suicidal ideas, and Ability to identify and develop effective coping behaviors will improve  Medication Management: RN will administer medications as ordered by provider, will assess and evaluate patient's response and provide education to patient for prescribed medication. RN will report any adverse and/or side effects to prescribing provider.  Therapeutic Interventions: 1 on 1 counseling sessions, Psychoeducation, Medication administration, Evaluate responses to treatment, Monitor vital signs and CBGs as ordered, Perform/monitor CIWA, COWS, AIMS and Fall Risk screenings as ordered, Perform wound care treatments as ordered.  Evaluation of Outcomes: Not Met   LCSW Treatment Plan for Primary Diagnosis: Paranoia ([place]) Long Term Goal(s): Safe transition to appropriate next level of care at discharge, Engage patient in therapeutic group addressing interpersonal concerns.  Short Term Goals: Engage patient in aftercare planning with referrals and resources, Increase social support, Increase emotional regulation, Facilitate acceptance of mental health diagnosis and concerns, Identify triggers associated with mental health/substance abuse issues, and Increase skills for wellness and recovery  Therapeutic Interventions: Assess for all discharge needs, 1 to 1 time with Social worker, Explore available resources and support systems, Assess for adequacy in community support network, Educate family and significant other(s) on suicide prevention, Complete Psychosocial Assessment, Interpersonal group therapy.  Evaluation of Outcomes: Not Met   Progress in Treatment: Attending groups: No. Participating in groups: No.  Taking medication  as prescribed: Yes. Toleration medication: Yes. Family/Significant other contact made: Yes, individual(s) contacted:  Step-Son  Patient understands diagnosis: Yes. Discussing patient identified problems/goals with staff: Yes. Medical problems stabilized or resolved: Yes. Denies suicidal/homicidal ideation: Yes. Issues/concerns per patient self-inventory: No.   New problem(s) identified: No, Describe:  None   New Short Term/Long Term Goal(s): medication stabilization, elimination of SI thoughts, development of comprehensive mental wellness plan.   Patient Goals: "To get sober from Cocaine"   Discharge Plan or Barriers: Patient recently admitted. CSW will continue to follow and assess for appropriate referrals and possible discharge planning.   Reason for Continuation of Hospitalization: Anxiety Hallucinations Medication stabilization  Estimated Length of Stay: 3 to 7 days   Last 3 [place] Suicide Severity Risk Score: [place] Admission (Current) from 06/29/2022 in [facility] ED from 06/27/2022 in [facility] Emergency Department at [place] No Risk Error: Question 6 not populated       Last [facility] 2/9 Scores:     No data to display          Scribe for Treatment Team: Darleen Crocker, Latanya Presser 06/30/2022 1:51 PM

## 2022-06-30 NOTE — BHH Group Notes (Signed)
Adult Psychoeducational Group Note  Date:  06/30/2022 Time:  5:00 PM  Group Topic/Focus:  Goals Group:   The focus of this group is to help patients establish daily goals to achieve during treatment and discuss how the patient can incorporate goal setting into their daily lives to aide in recovery.  Participation Level:  Did Not Attend  Dennis Black 06/30/2022, 5:00 PM

## 2022-06-30 NOTE — Progress Notes (Signed)
Adult Psychoeducational Group Note  Date:  06/30/2022 Time:  10:36 PM  Group Topic/Focus:  Wrap-Up Group:   The focus of this group is to help patients review their daily goal of treatment and discuss progress on daily workbooks.  Participation Level:  None  Participation Quality:  Attentive  Affect:  Appropriate  Cognitive:   n/a  Insight: None  Engagement in Group:  None  Modes of Intervention:  Discussion, Rapport Building, and Support  Additional Comments:   Pt attended the Wrap Up group. Pt did not participate, however was cooperative and attentive.   Wetzel Bjornstad Lashelle Koy 06/30/2022, 10:36 PM
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
## 2022-06-30 NOTE — BHH Group Notes (Signed)
Adult Psychoeducational Group Note  Date:  06/30/2022 Time:  7:50 PM  Group Topic/Focus:  BING: Helps to improve memory and other cognitive skills, as well as strengthen focus and concentration.   Participation Level:  Did Not Attend  Richardean Chimera 06/30/2022, 7:50 PM

## 2022-06-30 NOTE — BHH Group Notes (Signed)
[facility] Group Notes:  (Nursing/MHT/Case Management/Adjunct)  Date:  06/30/2022  Time:  9:26 AM  Type of Therapy:  Group Topic/ Focus: Goals Group: The focus of this group is to help patients establish daily goals to achieve during treatment and discuss how the patient can incorporate goal setting into their daily lives to aide in recovery.   Participation Level:  Did Not Attend  Summary of Progress/Problems:  Patient did not attend goals group today. Patient was encouraged but refused.   Elza Rafter 06/30/2022, 9:26 AM
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
## 2022-07-01 DIAGNOSIS — F22 Delusional disorders: Secondary | ICD-10-CM | POA: Diagnosis not present

## 2022-07-01 LAB — HEMOGLOBIN A1C
Hgb A1c MFr Bld: 6.4 % — ABNORMAL HIGH (ref 4.8–5.6)
Mean Plasma Glucose: 136.98 mg/dL

## 2022-07-01 LAB — VITAMIN B12: Vitamin B-12: 534 pg/mL (ref 180–914)

## 2022-07-01 LAB — LIPID PANEL
Cholesterol: 169 mg/dL (ref 0–200)
HDL: 30 mg/dL — ABNORMAL LOW (ref >40)
LDL Cholesterol: 116 mg/dL — ABNORMAL HIGH (ref 0–99)
Total CHOL/HDL Ratio: 5.6 RATIO
Triglycerides: 114 mg/dL (ref <150)
VLDL: 23 mg/dL (ref 0–40)

## 2022-07-01 LAB — TSH: TSH: 4.562 u[IU]/mL — ABNORMAL HIGH (ref 0.350–4.500)

## 2022-07-01 NOTE — Group Note (Signed)
Date:  07/01/2022 Time:  4:37 PM  Group Topic/Focus:  Wellness Toolbox:   The focus of this group is to discuss various aspects of wellness, balancing those aspects and exploring ways to increase the ability to experience wellness.  Patients will create a wellness toolbox for use upon discharge.    Participation Level:  Active  Participation Quality:  Appropriate  Affect:  Appropriate  Cognitive:  Appropriate  Insight: Appropriate  Engagement in Group:  Engaged  Modes of Intervention:  Exploration  Additional Comments:   Patient participated in the mindfulness-Self soothing group today with the help of his interpreter. Patient stated he wants to get to the point where he can calm himself when  it seems nothing is going right in his life.[name] 07/01/2022, 4:37 PM

## 2022-07-01 NOTE — Progress Notes (Signed)

## 2022-07-01 NOTE — Progress Notes (Signed)
   07/01/22 1057  Psych Admission Type (Psych Patients Only)  Admission Status Voluntary  Psychosocial Assessment  Patient Complaints Substance abuse  Eye Contact Brief  Facial Expression Worried  Affect Appropriate to circumstance  Speech Logical/coherent  Interaction Assertive  Motor Activity Other (Comment) (WNL)  Appearance/Hygiene Unremarkable  Behavior Characteristics Cooperative  Mood Preoccupied  Aggressive Behavior  Effect No apparent injury  Thought Process  Coherency WDL  Content Paranoia;Preoccupation  Delusions Paranoid  Perception Hallucinations  Hallucination Auditory  Judgment WDL  Confusion None  Danger to Self  Current suicidal ideation? Denies  Danger to Others  Danger to Others None reported or observed
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
## 2022-07-01 NOTE — Progress Notes (Signed)
   07/01/22 2200  Psych Admission Type (Psych Patients Only)  Admission Status Voluntary  Psychosocial Assessment  Patient Complaints Substance abuse  Eye Contact Brief  Facial Expression Sad;Worried  Affect Appropriate to circumstance  Speech Logical/coherent  Interaction Minimal  Motor Activity Other (Comment) (wnl)  Appearance/Hygiene In scrubs  Behavior Characteristics Cooperative;Appropriate to situation  Mood Sad;Preoccupied  Aggressive Behavior  Effect No apparent injury  Thought Process  Coherency WDL  Content Preoccupation;Paranoia  Delusions Paranoid  Perception Hallucinations  Hallucination Auditory  Judgment WDL  Confusion None  Danger to Self  Current suicidal ideation? Denies  Danger to Others  Danger to Others None reported or observed

## 2022-07-01 NOTE — [facility] Group Notes (Signed)
Patient did not attend the Wrap-up group. 

## 2022-07-02 DIAGNOSIS — F22 Delusional disorders: Secondary | ICD-10-CM | POA: Diagnosis not present

## 2022-07-02 MED ORDER — RISPERIDONE 2 MG PO TABS
2.0000 mg | ORAL_TABLET | Freq: Every day | ORAL | Status: DC
  Administered 2022-07-02 – 2022-07-05 (×4): 2 mg via ORAL
  Filled 2022-07-02 (×6): qty 1

## 2022-07-02 MED ORDER — RISPERIDONE 1 MG PO TABS
1.0000 mg | ORAL_TABLET | Freq: Every day | ORAL | Status: DC
  Administered 2022-07-03 – 2022-07-06 (×4): 1 mg via ORAL
  Filled 2022-07-02 (×4): qty 1
  Filled 2022-07-02: qty 21
  Filled 2022-07-02: qty 1

## 2022-07-02 NOTE — Progress Notes (Signed)
   07/02/22 0545  15 Minute Checks  Location Bedroom  Visual Appearance Calm  Behavior Sleeping  Sleep (Behavioral Health Patients Only)  Calculate sleep? (Click Yes once per 24 hr at 0600 safety check) Yes  Documented sleep last 24 hours 8.5    
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
## 2022-07-02 NOTE — BHH Suicide Risk Assessment (Signed)
[facility] INPATIENT:  Family/Significant Other Suicide Prevention Education  Suicide Prevention Education:  Education Completed; Dennis Black, [phone]   (name of family member/significant other) has been identified by the patient as the family member/significant other with whom the patient will be residing, and identified as the person(s) who will aid the patient in the event of a mental health crisis (suicidal ideations/suicide attempt).  With written consent from the patient, the family member/significant other has been provided the following suicide prevention education, prior to the and/or following the discharge of the patient.   CSW spoke with patient son who reports no additional safety concerns.  They are interested in information for longer term residential programs.  CSW provided son information about [facility]. No guns/weapons in the house.  Family will pick patient up and state they are looking for an alternative place for patient to stay.   The suicide prevention education provided includes the following: Suicide risk factors Suicide prevention and interventions National Suicide Hotline telephone number [facility] assessment telephone number [facility] Emergency Assistance [place] and/or Residential Mobile Crisis Unit telephone number  Request made of family/significant other to: Remove weapons (e.g., guns, rifles, knives), all items previously/currently identified as safety concern.   Remove drugs/medications (over-the-counter, prescriptions, illicit drugs), all items previously/currently identified as a safety concern.  The family member/significant other verbalizes understanding of the suicide prevention education information provided.  The family member/significant other agrees to remove the items of safety concern listed above.  Crislyn Willbanks E Kura Bethards 07/02/2022, 2:07 PM

## 2022-07-02 NOTE — BHH Group Notes (Signed)
Pt attended AA meeting this evening.  

## 2022-07-02 NOTE — Progress Notes (Signed)
   07/02/22 0906  Psych Admission Type (Psych Patients Only)  Admission Status Voluntary  Psychosocial Assessment  Patient Complaints Substance abuse  Eye Contact Brief  Facial Expression Worried  Affect Appropriate to circumstance  Speech Logical/coherent  Interaction Minimal  Motor Activity Other (Comment) (WNL)  Appearance/Hygiene Improved  Behavior Characteristics Cooperative;Appropriate to situation  Mood Preoccupied;Sad  Aggressive Behavior  Effect No apparent injury  Thought Process  Coherency WDL  Content Paranoia;Preoccupation  Delusions Paranoid  Perception Hallucinations  Hallucination Auditory  Judgment WDL  Confusion None  Danger to Self  Current suicidal ideation? Denies  Danger to Others  Danger to Others None reported or observed

## 2022-07-02 NOTE — Group Note (Signed)
Date:  07/02/2022 Time:  10:49 AM  Group Topic/Focus:  Orientation:   The focus of this group is to educate the patient on the purpose and policies of crisis stabilization and provide a format to answer questions about their admission.  The group details unit policies and expectations of patients while admitted.    Participation Level:  Active  Participation Quality:  Appropriate  Affect:  Appropriate  Cognitive:  Appropriate  Insight: Appropriate  Engagement in Group:  Engaged  Modes of Intervention:  Discussion  Additional Comments:   Patient was active with his interpreter.  [name] 07/02/2022, 10:49 AM

## 2022-07-02 NOTE — Group Note (Signed)
Recreation Therapy Group Note   Group Topic:Health and Wellness  Group Date: 07/02/2022 Start Time: 9833 End Time: 0958 Facilitators: Sima Lindenberger-McCall, LRT,CTRS Location: [address] Dayroom   Goal Area(s) Addresses:  Patient will define components of whole wellness. Patient will verbalize benefit of whole wellness.  Group Description: Mental Gymnastics. LRT and patients discussed the components of wellness (mental, physical and spiritual). LRT and patients also discussed the importance of wellness and how it affects [place] on a daily basis. LRT then gave patients two worksheets of brain teasers. LRT explained to patients instead of doing physical exercise, they were going to exercise their brains and solve the brain teasers presented on the worksheets. Patients were given 20 minutes to decode as many of brain teasers they could before they went over them as a group     Affect/Mood: Appropriate   Participation Level: Active   Participation Quality: Independent   Behavior: Appropriate and Attentive    Speech/Thought Process: Logical   Insight: Moderate   Judgement: Moderate   Modes of Intervention: Worksheet   Patient Response to Interventions:  Engaged   Education Outcome:  Acknowledges education and In group clarification offered    Clinical Observations/Individualized Feedback: Pt was bright and was able to complete the worksheet with the assistance of an interpreter.     Plan: Continue to engage patient in RT group sessions 2-3x/week.   Rayson Rando-McCall, LRT,CTRS 07/02/2022 11:31 AM

## 2022-07-03 DIAGNOSIS — F22 Delusional disorders: Secondary | ICD-10-CM | POA: Diagnosis not present

## 2022-07-03 LAB — MISC LABCORP TEST (SEND OUT): Labcorp test code: 81950

## 2022-07-03 MED ORDER — FLUTICASONE PROPIONATE 50 MCG/ACT NA SUSP
2.0000 | Freq: Every day | NASAL | Status: DC
  Administered 2022-07-03 – 2022-07-06 (×4): 2 via NASAL
  Filled 2022-07-03 (×2): qty 16

## 2022-07-03 MED ORDER — TRAZODONE HCL 50 MG PO TABS
50.0000 mg | ORAL_TABLET | Freq: Every day | ORAL | Status: DC
  Administered 2022-07-03 – 2022-07-05 (×3): 50 mg via ORAL
  Filled 2022-07-03 (×4): qty 1
  Filled 2022-07-03: qty 7
  Filled 2022-07-03: qty 1

## 2022-07-03 MED ORDER — LORATADINE 10 MG PO TABS
10.0000 mg | ORAL_TABLET | Freq: Every day | ORAL | Status: DC
  Administered 2022-07-03 – 2022-07-06 (×4): 10 mg via ORAL
  Filled 2022-07-03 (×4): qty 1
  Filled 2022-07-03: qty 7
  Filled 2022-07-03 (×3): qty 1

## 2022-07-03 NOTE — Inpatient Diabetes Management (Signed)
Inpatient Diabetes Program Recommendations  AACE/ADA: New Consensus Statement on Inpatient Glycemic Control (2015)  Target Ranges:  Prepandial:   less than 140 mg/dL      Peak postprandial:   less than 180 mg/dL (1-2 hours)      Critically ill patients:  140 - 180 mg/dL   Lab Results  Component Value Date   HGBA1C 6.4 (H) 07/01/2022    Review of Glycemic Control  Diabetes history: None Outpatient Diabetes medications: None Current orders for Inpatient glycemic control: None  HgbA1C - 6.4%  Inpatient Diabetes Program Recommendations:    Consider adding Novolog 0-9 units TID  HgbA1C of 6.4% indicates prediabetes. Recommend lifestyle modification with healthy diet, exercise and weight loss.   Will need to f/u with PCP after discharge.  Thank you. Lorenda Peck, RD, LDN, [place] Inpatient Diabetes Coordinator [phone]

## 2022-07-03 NOTE — Progress Notes (Signed)
     07/02/22 2126  Psych Admission Type (Psych Patients Only)  Admission Status Voluntary  Psychosocial Assessment  Patient Complaints Substance abuse  Eye Contact Brief  Facial Expression Sad  Affect Appropriate to circumstance  Speech Logical/coherent  Interaction Minimal  Motor Activity Other (Comment) (WDL)  Appearance/Hygiene Improved  Behavior Characteristics Cooperative;Appropriate to situation  Mood Preoccupied;Sad  Thought Process  Coherency WDL  Content Other (Comment);Preoccupation  Delusions Paranoid  Perception Hallucinations  Hallucination Auditory  Judgment WDL  Confusion None  Danger to Self  Current suicidal ideation? Denies  Danger to Others  Danger to Others None reported or observed

## 2022-07-03 NOTE — Progress Notes (Signed)
   07/03/22 0545  15 Minute Checks  Location Bedroom  Visual Appearance Calm  Behavior Sleeping  Sleep (Behavioral Health Patients Only)  Calculate sleep? (Click Yes once per 24 hr at 0600 safety check) Yes  Documented sleep last 24 hours 7.25

## 2022-07-03 NOTE — BHH Group Notes (Signed)
Goals Group 07/03/22   Group Focus: affirmation, clarity of thought, and goals/reality orientation Treatment Modality:  Psychoeducation Interventions utilized were assignment, group exercise, and support Purpose: To be able to understand and verbalize the reason for their admission to the hospital. To understand that the medication helps with their chemical imbalance but they also need to work on their choices in life. To be challenged to develop a list of 30 positives about themselves. Also introduce the concept that "feelings" are not reality.  Participation Level:  Active  Participation Quality:  Appropriate  Affect:  Appropriate  Cognitive:  Appropriate  Insight:  Improving  Engagement in Group:  Engaged  Additional Comments:  Rates his energy at a 9/10. Participated in the group.  Paulino Rily

## 2022-07-03 NOTE — Progress Notes (Signed)
[facility] MD Progress Note  07/03/2022 3:11 PM Dennis Black  MRN:  [id number]  Reason for admission: Dennis Black is a 42 yo Caucasian male with a history of cocaine abuse who was taken to the [facility] health urgent care by [facility] with complaints of paranoia. [employer] was called after pt was found running down a street stating that people are after him, and are trying to kill him. Pt was subsequently transferred voluntarily to this behavioral health Hospital for treatment and stabilization of his mental status.   Chart Reviewed Last 24 hours: vital signs within normal limits in the past 24 hours.  Patient compliant with medications.  Only as needed medication taken in the past 24 hours was Tylenol 650 mg earlier today morning.  Patient has been visible on the unit, and has attended some unit groups in the past 24 hours.  Sleep hours documented last night as 7.2 hours as per nursing staff.  Patient assessment note 07/03/2022:  Pt is pleasant on approach, speaks some English, but this assessment was completed with the assistance of an in person language interpreter provider by [facility]. Pt's attention to personal hygiene and grooming is fair, eye contact is good, speech is clear & coherent. Thought contents are organized and logical, and pt currently denies SI/HI/AVH.  Patient reports that his sleep quality last night was poor, we will schedule trazodone instead of keeping it as as needed.  He reports a good appetite.  Patient continues to present with paranoia, and presents with delusions of persecution.  He reports that yesterday, someone was admitted to the unit and he thought that the person was sent as a spy to watch over him, and eventually kill him.  Patient educated on the fact that he is safe here on the unit.  Patient also reports to writer that he feels as though other people know what he is thinking, and states that the last time he felt this way was yesterday.  He reports right ear  pain, which comes and goes, reports nasal stuffiness and drainage which is worse at night.  Patient educated that Flonase and Claritin will be ordered for him for seasonal allergic rhinitis.  Other medications will be continued as listed below.  Labs independently reviewed on 07/03/2022: TSH was 4.562 on 02/1.  We will order free T3, and free T4.  Hemoglobin A1c is 6.4 rendering patient prediabetic.  We will put in consult to diabetic coordinator to ascertain if patient is to be started on metformin or if he needs to follow up with his PCP after discharge.  Lipid panel mostly within normal limits with LDL of 116.  UA turbid in appearance otherwise within normal limits.  BMP WNL and CBC mostly WNL. EKG with Qtc 418.  Principal Problem: Paranoia ([place]) Diagnosis: Active Problems:   MDD (major depressive disorder), single episode, severe with psychosis ([place])   GAD (generalized anxiety disorder)   Nightmares   Insomnia   Cocaine dependence ([place])  Total Time spent with patient: 20 minutes  Past Psychiatric History: See H&P  Past Medical History: History reviewed. No pertinent past medical history. History reviewed. No pertinent surgical history. Family History: History reviewed. No pertinent family history. Family Psychiatric  History: See H&P Social History:  Social History   Substance and Sexual Activity  Alcohol Use None     Social History   Substance and Sexual Activity  Drug Use Yes   Types: Cocaine    Social History   Socioeconomic History  Marital status: Unknown    Spouse name: Not on file   Number of children: Not on file   Years of education: Not on file   Highest education level: Not on file  Occupational History   Not on file  Tobacco Use   Smoking status: Never   Smokeless tobacco: Never  Substance and Sexual Activity   Alcohol use: Not on file   Drug use: Yes    Types: Cocaine   Sexual activity: Not on file  Other Topics Concern   Not on file  Social History  Narrative   Not on file   Social Determinants of Health   Financial Resource Strain: Not on file  Food Insecurity: Unknown (06/29/2022)   Hunger Vital Sign    Worried About Running Out of Food in the Last Year: Patient refused    [place] in the Last Year: Patient refused  Transportation Needs: No Transportation Needs (06/29/2022)   PRAPARE - Hydrologist (Medical): No    Lack of Transportation (Non-Medical): No  Physical Activity: Not on file  Stress: Not on file  Social Connections: Not on file   Additional Social History:    Attempts were made to contact the patient's son and employer but the phone numbers were not available.  Staff will attempt to get it out of his cell phone today if possible.  Collateral is pending at this time.                   Sleep: Fair  Appetite:  Good  Current Medications: Current Facility-Administered Medications  Medication Dose Route Frequency Provider Last Rate Last Admin   acetaminophen (TYLENOL) tablet 650 mg  650 mg Oral Q6H PRN Charmaine Downs C, NP   650 mg at 06/30/22 0748   alum & mag hydroxide-simeth (MAALOX/MYLANTA) 200-200-20 MG/5ML suspension 30 mL  30 mL Oral Q4H PRN Charmaine Downs C, NP       escitalopram (LEXAPRO) tablet 10 mg  10 mg Oral Daily Nicholes Rough, NP   10 mg at 07/03/22 0802   fluticasone (FLONASE) 50 MCG/ACT nasal spray 2 spray  2 spray Each Nare Daily Nicholes Rough, NP       hydrOXYzine (ATARAX) tablet 25 mg  25 mg Oral TID PRN Nicholes Rough, NP       loratadine (CLARITIN) tablet 10 mg  10 mg Oral Daily Raden Byington, NP       magnesium hydroxide (MILK OF MAGNESIA) suspension 30 mL  30 mL Oral Daily PRN Charmaine Downs C, NP       prazosin (MINIPRESS) capsule 1 mg  1 mg Oral QHS Kashvi Prevette, NP   1 mg at 07/02/22 2126   risperiDONE (RISPERDAL) tablet 1 mg  1 mg Oral Daily Ranae Palms, MD   1 mg at 07/03/22 0802   risperiDONE (RISPERDAL) tablet 2 mg  2 mg Oral  QHS Ranae Palms, MD   2 mg at 07/02/22 2126   traZODone (DESYREL) tablet 50 mg  50 mg Oral QHS Nicholes Rough, NP        Lab Results:  No results found for this or any previous visit (from the past 75 hour(s)).   Blood Alcohol level:  Lab Results  Component Value Date   ETH <10 44/07/4740    Metabolic Disorder Labs: Lab Results  Component Value Date   HGBA1C 6.4 (H) 07/01/2022   MPG 136.98 07/01/2022   No results found for: "PROLACTIN" Lab  Results  Component Value Date   CHOL 169 07/01/2022   TRIG 114 07/01/2022   HDL 30 (L) 07/01/2022   CHOLHDL 5.6 07/01/2022   VLDL 23 07/01/2022   LDLCALC 116 (H) 07/01/2022    Physical Findings: AIMS:  , ,  ,  ,    CIWA:    COWS:     Musculoskeletal: Strength & Muscle Tone: within normal limits Gait & Station: normal Patient leans: N/A  Psychiatric Specialty Exam:  Presentation  General Appearance:  Appropriate for Environment; Fairly Groomed  Eye Contact: Good  Speech: Clear and Coherent  Speech Volume: Normal  Handedness: Right   Mood and Affect  Mood: Depressed  Affect: Congruent   Thought Process  Thought Processes: Coherent  Descriptions of Associations:Intact  Orientation:Full (Time, Place and Person)  Thought Content:Logical  History of Schizophrenia/Schizoaffective disorder:No  Duration of Psychotic Symptoms:Less than six months  Hallucinations:Hallucinations: None  Ideas of Reference:Paranoia; Delusions  Suicidal Thoughts:Suicidal Thoughts: No  Homicidal Thoughts:Homicidal Thoughts: No   Sensorium  Memory: Immediate Good  Judgment: Fair  Insight: Fair   Chartered certified accountant: Fair  Attention Span: Fair  Recall: [employer] of Knowledge: Fair  Language: Fair   Psychomotor Activity  Psychomotor Activity: Psychomotor Activity: Normal   Assets  Assets: Communication Skills   Sleep  Sleep: Sleep: Poor    Physical  Exam: Physical Exam HENT:     Head: Normocephalic.     Nose: Nose normal.  Neurological:     Mental Status: He is oriented to person, place, and time.    Review of Systems  Psychiatric/Behavioral:  Positive for depression and substance abuse. Negative for hallucinations, memory loss and suicidal ideas. The patient is nervous/anxious and has insomnia.   All other systems reviewed and are negative.  Blood pressure 114/83, pulse 75, temperature (!) 97.5 F (36.4 C), temperature source Oral, resp. rate 16, height 5\' 6"  (1.676 m), weight 90.3 kg, SpO2 95 %. Body mass index is 32.12 kg/m.   Treatment Plan Summary: Daily contact with patient to assess and evaluate symptoms and progress in treatment, Medication management, and Plan   ASSESSMENT:  Diagnoses / Active Problems: Active Problems:   MDD (major depressive disorder), single episode, severe with psychosis (H[facility])   GAD (generalized anxiety disorder)   Nightmares   Insomnia   Cocaine dependence ([facility])  PLAN: Safety and Monitoring:  --  Voluntary admission to inpatient psychiatric unit for safety, stabilization and treatment  -- Daily contact with patient to assess and evaluate symptoms and progress in treatment  -- Patient's case to be discussed in multi-disciplinary team meeting  -- Observation Level : q15 minute checks  -- Vital signs:  q12 hours  -- Precautions: suicide, elopement, and assault  2. Psychiatric Diagnoses and Treatment:   Active Problems:   MDD (major depressive disorder), single episode, severe with psychosis (HCC)   GAD (generalized anxiety disorder)   Nightmares   Insomnia   Cocaine dependence ([facility]) --  The risks/benefits/side-effects/alternatives to this medication were discussed in detail with the patient and time was given for questions. The patient consents to medication trial.  -- FDA approved medication to include : -Continue Risperdal 1 mg in the morning and 2 mg at night. -Continue Lexapro 10  mg a day for depressive symptoms -Continue trazodone 50 mg as needed at night, change to scheduled medication -Continue Minipress 1 mg at night for nightmares. -Start Flonase daily for nasal stuffiness -Start Claritin daily for seasonal allergic rhinitis  --  Metabolic profile and EKG monitoring obtained while on an atypical antipsychotic (BMI: Lipid Panel: HbgA1c: QTc:) done  -- Encouraged patient to participate in unit milieu and in scheduled group therapies   -- Short Term Goals: Ability to identify changes in lifestyle to reduce recurrence of condition will improve, Ability to verbalize feelings will improve, Ability to demonstrate self-control will improve, and Compliance with prescribed medications will improve  -- Long Term Goals: Improvement in symptoms so as ready for discharge   3. Medical Issues Being Addressed:   Tobacco Use Disorder  -- Nicotine patch 21mg /24 hours ordered  -- Smoking cessation encouraged  4. Discharge Planning:   -- Social work and case management to assist with discharge planning and identification of hospital follow-up needs prior to discharge  -- Estimated LOS: 3 to 4 days.  -- Discharge Concerns: Need to establish a safety plan; Medication compliance and effectiveness  -- Discharge Goals: Return home with outpatient referrals for mental health follow-up including medication management/psychotherapy  Nicholes Rough, NP 07/03/2022, 3:11 PM   Patient ID: Braxon Suder, male   DOB: [birth year], 42 y.o.   MRN: [id number] Patient ID: Emmette Katt, male   DOB: 23-Aug-1980, 42 y.o.   MRN: [id number]

## 2022-07-03 NOTE — Plan of Care (Signed)
  Problem: Education: Goal: Emotional status will improve Outcome: Progressing Goal: Mental status will improve Outcome: Progressing Goal: Verbalization of understanding the information provided will improve Outcome: Progressing   Problem: Activity: Goal: Sleeping patterns will improve Outcome: Progressing   Problem: Coping: Goal: Ability to demonstrate self-control will improve Outcome: Progressing   Problem: Health Behavior/Discharge Planning: Goal: Compliance with treatment plan for underlying cause of condition will improve Outcome: Progressing

## 2022-07-03 NOTE — [facility] Group Notes (Signed)
.  Psychoeducational Group Note    Date:  07/03/2022 Time:1300-1400    Purpose of Group: . The group focus' on teaching patients on how to identify their needs and their Life Skills:  A group where two lists are made. What people need and what are things that we do that are unhealthy. The lists are developed by the patients and it is explained that we often do the actions that are not healthy to get our list of needs met.  Goal:: to develop the coping skills needed to get their needs met  Participation Level:  did not attend Erland Vivas A  

## 2022-07-03 NOTE — Progress Notes (Signed)
Pt on unit. Pt medication compliant. Pt using interpreter to communicate effectively with staff. Pt endorses depression and anxiety. Pt denies suicidal and homicidal thoughts. Pt denies a/v hallucinations. Pt endorses feelings of paranoia. Q 15 minute checks ongoing for safety.

## 2022-07-04 DIAGNOSIS — F22 Delusional disorders: Secondary | ICD-10-CM | POA: Diagnosis not present

## 2022-07-04 LAB — GLUCOSE, CAPILLARY
Glucose-Capillary: 277 mg/dL — ABNORMAL HIGH (ref 70–99)
Glucose-Capillary: 247 mg/dL — ABNORMAL HIGH (ref 70–99)

## 2022-07-04 LAB — T4, FREE: Free T4: 1.17 ng/dL — ABNORMAL HIGH (ref 0.61–1.12)

## 2022-07-04 MED ORDER — INSULIN ASPART 100 UNIT/ML IJ SOLN
0.0000 [IU] | Freq: Three times a day (TID) | INTRAMUSCULAR | Status: DC
  Administered 2022-07-04: 5 [IU] via SUBCUTANEOUS
  Administered 2022-07-05: 2 [IU] via SUBCUTANEOUS
  Administered 2022-07-05: 1 [IU] via SUBCUTANEOUS
  Administered 2022-07-05: 3 [IU] via SUBCUTANEOUS

## 2022-07-04 NOTE — Progress Notes (Signed)
Pt on unit attending groups. Pt participating with use of interpreter due to language barrier. Pt medication compliant. Pt denies suicidal thoughts. Pt endorses some paranoia. Q 15 minute checks ongoing for safety.

## 2022-07-04 NOTE — [facility] Group Notes (Signed)
PAdult Psychoeducational Group  Date:  07/04/2022 Time: 1300-1400  Group Topic/Focus: Continuation of the group from Saturday. Looking at the lists that were created and talking about what needs to be done with the homework of 30 positives about themselves.                                     Talking about taking their power back and helping themselves to develop a positive self esteem.      Participation Quality:  Appropriate  Affect:  Appropriate  Cognitive:  Oriented  Insight: Improving  Engagement in Group:  Engaged  Modes of Intervention:  Activity, Discussion, Education, and Support  Additional Comments:  Pt rates his energy at a 10/10. Participated fully in the group with the help of the interpreter   Paulino Rily

## 2022-07-04 NOTE — [facility] Group Notes (Signed)
Wrap up group 1-10 10 

## 2022-07-04 NOTE — [facility] Group Notes (Signed)
Adult Psychoeducational Group Note Date:  07/04/2022 Time:  0900-1000 Group Topic/Focus: PROGRESSIVE RELAXATION. A group where deep breathing is taught and tensing and relaxation muscle groups is used. Imagery is used as well.  Pts are asked to imagine 3 pillars that hold them up when they are not able to hold themselves up and to share that with the group.   Participation Level:  Active  Participation Quality:  Appropriate  Affect:  Appropriate  Cognitive:  Approprate  Insight: Improving  Engagement in Group:  Engaged  Modes of Intervention:  deep breathing, Imagery. Discussion  Additional Comments:  Pt was there with his interpreter. Rates his energy at a 9/10. States his grandson, daughter and his mother holds him up.   : Paulino Rily

## 2022-07-04 NOTE — Progress Notes (Signed)
Patient has been isolative to room this shift, compliant with medications and has had no complaints of withdrawal. Patient has had no behavioral dyscontrol.   Assess patient for safety, offer medications as prescribed, engage patient in 1:1 therapeutic staff talks.   Patient able to contract for safety. Continue to monitor as planned.

## 2022-07-04 NOTE — Progress Notes (Signed)
[facility] MD Progress Note  07/04/2022 3:48 PM Dennis Black  MRN:  [id number]  Reason for admission: Dennis Black is a 42 yo Caucasian male with a history of cocaine abuse who was taken to the [facility] health urgent care by [facility] with complaints of paranoia. [employer] was called after pt was found running down a street stating that people are after him, and are trying to kill him. Pt was subsequently transferred voluntarily to this behavioral health Hospital for treatment and stabilization of his mental status.   Chart Reviewed Last 24 hours: vital signs continue to be within normal limits in the past 24 hours.  Patient is continuing to be compliant with medications.  Has not required any PRN medications in the past 24 hrs. Pt is visible on the unit and has been attending unit group sessions. No behavioral episodes noted in the past 24 hrs.  Patient assessment note 07/05/2022:  Pt is pleasant on approach, speaks some broken English, but not comprehensible enough for an assessment. This assessment was completed with the assistance of an in person language interpreter provider by [facility] health. Pt's attention to personal hygiene and grooming today continues to be fair, eye contact is fair , speech is clear & coherent. Thought contents are organized and logical, and pt currently denies SI/HI/AVH.  Patient denies paranoia today, states that the last time that he had paranoia was on Friday, when the patient was admitted to the unit and he felt as though the patient was a spy.  Patient answers negatively to all questions related to delusional thoughts today.  He reports a fair sleep quality last night, but states that he could benefit from having his sleep medication increased a little further.  He reports a good appetite and denies being in any physical distress or pain.  We will leave trazodone at 50 mg nightly for now, since patient is also on Risperdal 2 mg nightly for psychosis and mood  stabilization.  Patient reports that he would like to go to substance abuse rehab after discharging from the hospital.  He ruminates also about may be going to a partial hospitalization program and stated, but states that he will talk to his family members first regarding his options, and also talked to his employer.  Patient educated to talk to his assigned CSW tomorrow after talking to his family and his employer.  Today, patient verbalized that even though his symptoms have significantly improved since hospitalization, he does not feel ready to be discharged yet, as he is continuing to benefit from the unit group sessions, and wants to have a solid plan in place prior to discharge, so as not to relapse on cocaine.  We will revisit discharge planning tomorrow with patient  Labs independently reviewed on 07/04/2022: TSH was 4.562 on 02/1.  Ordered free T3, and free T4. FT4 slightly elevated at 1.17. Will contract endocrinology to ascertain if this is significant for hypothyroidism. Hemoglobin A1c is 6.4 rendering patient prediabetic.  Consulted diabetic coordinator to ascertain if patient is to be started on metformin or if he needs to follow up with his PCP after discharge. Her recommendations are as follows:  "Consider adding Novolog 0-9 units TID  HgbA1C of 6.4% indicates prediabetes. Recommend lifestyle modification with healthy diet, exercise and weight loss.   Will need to f/u with PCP after discharge." Sliding scale placed.   Lipid panel mostly within normal limits with LDL of 116.  UA turbid in appearance otherwise within normal limits.  BMP WNL and CBC mostly WNL. EKG with Qtc 418.  Principal Problem: Paranoia ([place]) Diagnosis: Active Problems:   MDD (major depressive disorder), single episode, severe with psychosis ([place])   GAD (generalized anxiety disorder)   Nightmares   Insomnia   Cocaine dependence ([place])  Total Time spent with patient: 20 minutes  Past Psychiatric History: See  H&P  Past Medical History: History reviewed. No pertinent past medical history. History reviewed. No pertinent surgical history. Family History: History reviewed. No pertinent family history. Family Psychiatric  History: See H&P Social History:  Social History   Substance and Sexual Activity  Alcohol Use None     Social History   Substance and Sexual Activity  Drug Use Yes   Types: Cocaine    Social History   Socioeconomic History   Marital status: Unknown    Spouse name: Not on file   Number of children: Not on file   Years of education: Not on file   Highest education level: Not on file  Occupational History   Not on file  Tobacco Use   Smoking status: Never   Smokeless tobacco: Never  Substance and Sexual Activity   Alcohol use: Not on file   Drug use: Yes    Types: Cocaine   Sexual activity: Not on file  Other Topics Concern   Not on file  Social History Narrative   Not on file   Social Determinants of Health   Financial Resource Strain: Not on file  Food Insecurity: Unknown (06/29/2022)   Hunger Vital Sign    Worried About Running Out of Food in the Last Year: Patient refused    Ran Out of Food in the Last Year: Patient refused  Transportation Needs: No Transportation Needs (06/29/2022)   PRAPARE - Hydrologist (Medical): No    Lack of Transportation (Non-Medical): No  Physical Activity: Not on file  Stress: Not on file  Social Connections: Not on file   Additional Social History:    Attempts were made to contact the patient's son and employer but the phone numbers were not available.  Staff will attempt to get it out of his cell phone today if possible.  Collateral is pending at this time.    Sleep: Fair  Appetite:  Good  Current Medications: Current Facility-Administered Medications  Medication Dose Route Frequency Provider Last Rate Last Admin   acetaminophen (TYLENOL) tablet 650 mg  650 mg Oral Q6H PRN Charmaine Downs C, NP   650 mg at 06/30/22 0748   alum & mag hydroxide-simeth (MAALOX/MYLANTA) 200-200-20 MG/5ML suspension 30 mL  30 mL Oral Q4H PRN Charmaine Downs C, NP       escitalopram (LEXAPRO) tablet 10 mg  10 mg Oral Daily Nicholes Rough, NP   10 mg at 07/04/22 0830   fluticasone (FLONASE) 50 MCG/ACT nasal spray 2 spray  2 spray Each Nare Daily Nicholes Rough, NP   2 spray at 07/04/22 8250   hydrOXYzine (ATARAX) tablet 25 mg  25 mg Oral TID PRN Nicholes Rough, NP       insulin aspart (novoLOG) injection 0-9 Units  0-9 Units Subcutaneous TID WC Izen Petz, NP       loratadine (CLARITIN) tablet 10 mg  10 mg Oral Daily Hayli Milligan, NP   10 mg at 07/04/22 0830   magnesium hydroxide (MILK OF MAGNESIA) suspension 30 mL  30 mL Oral Daily PRN Delfin Gant, NP       prazosin (  MINIPRESS) capsule 1 mg  1 mg Oral QHS Merideth Bosque, NP   1 mg at 07/03/22 2112   risperiDONE (RISPERDAL) tablet 1 mg  1 mg Oral Daily Ranae Palms, MD   1 mg at 07/04/22 0829   risperiDONE (RISPERDAL) tablet 2 mg  2 mg Oral QHS Ranae Palms, MD   2 mg at 07/03/22 2112   traZODone (DESYREL) tablet 50 mg  50 mg Oral QHS Nicholes Rough, NP   50 mg at 07/03/22 2112    Lab Results:  No results found for this or any previous visit (from the past 55 hour(s)).   Blood Alcohol level:  Lab Results  Component Value Date   ETH <10 57/84/6962    Metabolic Disorder Labs: Lab Results  Component Value Date   HGBA1C 6.4 (H) 07/01/2022   MPG 136.98 07/01/2022   No results found for: "PROLACTIN" Lab Results  Component Value Date   CHOL 169 07/01/2022   TRIG 114 07/01/2022   HDL 30 (L) 07/01/2022   CHOLHDL 5.6 07/01/2022   VLDL 23 07/01/2022   LDLCALC 116 (H) 07/01/2022    Physical Findings: AIMS:  , ,  ,  ,    CIWA:    COWS:     Musculoskeletal: Strength & Muscle Tone: within normal limits Gait & Station: normal Patient leans: N/A  Psychiatric Specialty Exam:  Presentation  General  Appearance:  Appropriate for Environment; Fairly Groomed  Eye Contact: Fair  Speech: Clear and Coherent  Speech Volume: Normal  Handedness: Right   Mood and Affect  Mood: Depressed  Affect: Congruent   Thought Process  Thought Processes: Coherent  Descriptions of Associations:Intact  Orientation:Full (Time, Place and Person)  Thought Content:Logical  History of Schizophrenia/Schizoaffective disorder:No  Duration of Psychotic Symptoms:Less than six months  Hallucinations:Hallucinations: None  Ideas of Reference:None  Suicidal Thoughts:Suicidal Thoughts: No  Homicidal Thoughts:Homicidal Thoughts: No   Sensorium  Memory: Immediate Fair  Judgment: Fair  Insight: Fair   Community education officer  Concentration: Good  Attention Span: Good  Recall: Good  Fund of Knowledge: Good  Language: Good   Psychomotor Activity  Psychomotor Activity: Psychomotor Activity: Normal   Assets  Assets: Communication Skills   Sleep  Sleep: Sleep: Good    Physical Exam: Physical Exam HENT:     Head: Normocephalic.     Nose: Nose normal.  Neurological:     Mental Status: He is oriented to person, place, and time.    Review of Systems  Psychiatric/Behavioral:  Positive for depression and substance abuse. Negative for hallucinations, memory loss and suicidal ideas. The patient is nervous/anxious and has insomnia.   All other systems reviewed and are negative.  Blood pressure 120/80, pulse 73, temperature 97.8 F (36.6 C), temperature source Oral, resp. rate 18, height 5\' 6"  (1.676 m), weight 90.3 kg, SpO2 93 %. Body mass index is 32.12 kg/m.  Treatment Plan Summary: Daily contact with patient to assess and evaluate symptoms and progress in treatment, Medication management, and Plan   ASSESSMENT:  Diagnoses / Active Problems: Active Problems:   MDD (major depressive disorder), single episode, severe with psychosis ([place])   GAD (generalized  anxiety disorder)   Nightmares   Insomnia   Cocaine dependence ([place])  PLAN: Safety and Monitoring:  --  Voluntary admission to inpatient psychiatric unit for safety, stabilization and treatment  -- Daily contact with patient to assess and evaluate symptoms and progress in treatment  -- Patient's case to be discussed in multi-disciplinary team meeting  --  Observation Level : q15 minute checks  -- Vital signs:  q12 hours  -- Precautions: suicide, elopement, and assault  2. Psychiatric Diagnoses and Treatment:   Active Problems:   MDD (major depressive disorder), single episode, severe with psychosis ([facility])   GAD (generalized anxiety disorder)   Nightmares   Insomnia   Cocaine dependence ([facility]) --  The risks/benefits/side-effects/alternatives to this medication were discussed in detail with the patient and time was given for questions. The patient consents to medication trial.  -- FDA approved medication to include : -Continue Risperdal 1 mg in the morning and 2 mg at night. -Continue Lexapro 10 mg a day for depressive symptoms -Continue trazodone 50 mg as needed at night, change to scheduled medication -Continue Minipress 1 mg at night for nightmares. -Start Flonase daily for nasal stuffiness -Start Claritin daily for seasonal allergic rhinitis -Start Sliding Scale Novolog inslin as per [facility] (recommendations by Diabetic consultant)  -- Metabolic profile and EKG monitoring obtained while on an atypical antipsychotic (BMI: Lipid Panel: HbgA1c: QTc:) done  -- Encouraged patient to participate in unit milieu and in scheduled group therapies   -- Short Term Goals: Ability to identify changes in lifestyle to reduce recurrence of condition will improve, Ability to verbalize feelings will improve, Ability to demonstrate self-control will improve, and Compliance with prescribed medications will improve  -- Long Term Goals: Improvement in symptoms so as ready for discharge   3. Medical Issues  Being Addressed:   Tobacco Use Disorder  -- Nicotine patch 21mg /24 hours ordered  -- Smoking cessation encouraged  4. Discharge Planning:   -- Social work and case management to assist with discharge planning and identification of hospital follow-up needs prior to discharge  -- Estimated LOS: 3 to 4 days.  -- Discharge Concerns: Need to establish a safety plan; Medication compliance and effectiveness  -- Discharge Goals: Return home with outpatient referrals for mental health follow-up including medication management/psychotherapy  11-17-1983, NP 07/04/2022, 3:48 PM   Patient ID: Mong Neal, male   DOB: 21-Jan-1981, 42 y.o.   MRN: [id number] Patient ID: Gustabo Gordillo, male   DOB: 03-02-1981, 42 y.o.   MRN: [id number] Patient ID: Khoen Genet, male   DOB: 15-Dec-1980, 42 y.o.   MRN: [id number]

## 2022-07-04 NOTE — B[facility] Group Notes (Signed)
[facility] Group Notes:  (Nursing/MHT/Case Management/Adjunct)  Date:  07/04/2022  Time:  9:39 AM  Type of Therapy:   Goals Group  Participation Level:  Active  Participation Quality:  Appropriate  Affect:  Appropriate  Cognitive:  Appropriate  Insight:  Appropriate  Engagement in Group:  None  Modes of Intervention:  Orientation  Summary of Progress/Problems: Pt stated his goal for the day is to participate in therapy,  Chase Picket 07/04/2022, 9:39 AM

## 2022-07-05 ENCOUNTER — Encounter (HOSPITAL_COMMUNITY): Payer: Self-pay

## 2022-07-05 DIAGNOSIS — F22 Delusional disorders: Secondary | ICD-10-CM | POA: Diagnosis not present

## 2022-07-05 LAB — T3, FREE

## 2022-07-05 LAB — GLUCOSE, CAPILLARY
Glucose-Capillary: 166 mg/dL — ABNORMAL HIGH (ref 70–99)
Glucose-Capillary: 136 mg/dL — ABNORMAL HIGH (ref 70–99)
Glucose-Capillary: 235 mg/dL — ABNORMAL HIGH (ref 70–99)

## 2022-07-05 LAB — RESP PANEL BY RT-PCR (RSV, FLU A&B, COVID)  RVPGX2
Influenza A by PCR: NEGATIVE
Influenza B by PCR: NEGATIVE
Resp Syncytial Virus by PCR: NEGATIVE
SARS Coronavirus 2 by RT PCR: NEGATIVE

## 2022-07-05 MED ORDER — ONDANSETRON HCL 4 MG PO TABS: 4.0000 mg | ORAL_TABLET | Freq: Four times a day (QID) | ORAL | Status: DC | PRN

## 2022-07-05 MED ORDER — METFORMIN HCL ER 500 MG PO TB24
500.0000 mg | ORAL_TABLET | Freq: Every day | ORAL | Status: DC
  Administered 2022-07-06: 500 mg via ORAL
  Filled 2022-07-05: qty 1
  Filled 2022-07-05: qty 7
  Filled 2022-07-05: qty 1

## 2022-07-05 NOTE — Group Note (Signed)
Date:  07/05/2022 Time:  10:56 AM  Group Topic/Focus:  Orientation:   The focus of this group is to educate the patient on the purpose and policies of crisis stabilization and provide a format to answer questions about their admission.  The group details unit policies and expectations of patients while admitted.    Participation Level:  Minimal  Participation Quality:  Appropriate  Affect:  Appropriate  Cognitive:  Appropriate  Insight: Appropriate  Engagement in Group:  Limited  Modes of Intervention:  Discussion  Additional Comments:     [name] 07/05/2022, 10:56 AM

## 2022-07-05 NOTE — Progress Notes (Signed)
   07/04/22 2300  Psych Admission Type (Psych Patients Only)  Admission Status Voluntary  Psychosocial Assessment  Patient Complaints Anxiety;Substance abuse  Eye Contact Fair  Facial Expression Animated  Affect Sad  Speech Logical/coherent  Interaction Forwards little  Motor Activity Slow  Appearance/Hygiene Unremarkable  Behavior Characteristics Cooperative  Mood Pleasant  Thought Process  Coherency WDL  Content Paranoia  Delusions Paranoid  Perception WDL  Hallucination None reported or observed  Judgment Limited  Confusion None  Danger to Self  Current suicidal ideation? Denies (Denies)  Agreement Not to Harm Self Yes  Description of Agreement verbal  Danger to Others  Danger to Others None reported or observed   Patient compliant with medications stated he wanted to work on his substance abuse when he gets discharged. Denies SI/HI/VH and endorses AH stated he is feeling better since he has been in the hospital. Support and encouragement provided.

## 2022-07-05 NOTE — Group Note (Signed)
Occupational Therapy Group Note  Group Topic:Coping Skills  Group Date: 07/05/2022 Start Time: 1419 End Time: 1521 Facilitators: Brantley Stage, OT   Group Description: Group encouraged increased engagement and participation through discussion and activity focused on "Coping Ahead." Patients were split up into teams and selected a card from a stack of positive coping strategies. Patients were instructed to act out/charade the coping skill for other peers to guess and receive points for their team. Discussion followed with a focus on identifying additional positive coping strategies and patients shared how they were going to cope ahead over the weekend while continuing hospitalization stay.  Therapeutic Goal(s): Identify positive vs negative coping strategies. Identify coping skills to be used during hospitalization vs coping skills outside of hospital/at home Increase participation in therapeutic group environment and promote engagement in treatment   Participation Level: Engaged w/ Spanish interpreter present   Participation Quality: Independent   Behavior: Appropriate   Speech/Thought Process: Focused   Affect/Mood: Appropriate   Insight: Fair   Judgement: Fair   Individualization: pt was engaged w/ the assistance of the Spanish interpreter t/o this session  and during in their participation of group discussion/activity. New skills were identified  Modes of Intervention: Discussion and Education  Patient Response to Interventions:  WNL   Plan: Continue to engage patient in OT groups 2 - 3x/week.  07/05/2022  Brantley Stage, OT [name], OT

## 2022-07-05 NOTE — Group Note (Signed)
Recreation Therapy Group Note   Group Topic:Problem Solving  Group Date: 07/05/2022 Start Time: 0935 End Time: 1000 Facilitators: Reyce Lubeck-McCall, LRT,CTRS Location: [address] Day[address]   Goal Area(s) Addresses:  Patient will effectively work with peer towards shared goal.  Patient will identify skills used to make activity successful.  Patient will identify how skills used during activity can be used to reach post d/c goals.   Group Description: Landing Pad. In teams of 3-5, patients were given 12 plastic drinking straws and an equal length of masking tape. Using the materials provided, patients were asked to build a landing pad to catch a golf ball dropped from approximately 5 feet in the air. All materials were required to be used by the team in their design. LRT facilitated post-activity discussion.   Affect/Mood: Appropriate   Participation Level: Engaged   Participation Quality: Independent   Behavior: Appropriate   Speech/Thought Process: Focused   Insight: Good   Judgement: Good   Modes of Intervention: STEM Activity   Patient Response to Interventions:  Engaged   Education Outcome:  Acknowledges education and In group clarification offered    Clinical Observations/Individualized Feedback: Pt was engaged, bright and worked with peers in completing the activity.     Plan: Continue to engage patient in RT group sessions 2-3x/week.   Kinga Cassar-McCall, LRT,CTRS 07/05/2022 12:44 PM

## 2022-07-05 NOTE — Progress Notes (Signed)
   07/05/22 0800  Psych Admission Type (Psych Patients Only)  Admission Status Voluntary  Psychosocial Assessment  Patient Complaints Anxiety;Substance abuse  Eye Contact Fair  Facial Expression Animated  Affect Sad  Speech Logical/coherent  Interaction Forwards little  Motor Activity Slow  Appearance/Hygiene Unremarkable  Behavior Characteristics Cooperative  Mood Pleasant  Aggressive Behavior  Effect No apparent injury  Thought Process  Coherency WDL  Content Paranoia  Delusions None reported or observed  Perception WDL  Hallucination None reported or observed  Judgment Limited  Confusion None  Danger to Self  Current suicidal ideation? Denies  Agreement Not to Harm Self Yes  Description of Agreement Verbal  Danger to Others  Danger to Others None reported or observed

## 2022-07-05 NOTE — Plan of Care (Signed)
  Problem: Education: Goal: Knowledge of [place] General Education information/materials will improve Outcome: Progressing Goal: Emotional status will improve Outcome: Progressing Goal: Mental status will improve Outcome: Progressing Goal: Verbalization of understanding the information provided will improve Outcome: Progressing   Problem: Activity: Goal: Interest or engagement in activities will improve Outcome: Progressing Goal: Sleeping patterns will improve Outcome: Progressing   Problem: Coping: Goal: Ability to verbalize frustrations and anger appropriately will improve Outcome: Progressing Goal: Ability to demonstrate self-control will improve Outcome: Progressing   

## 2022-07-05 NOTE — BH IP Treatment Plan (Signed)
Interdisciplinary Treatment and Diagnostic Plan Update  07/05/2022 Time of Session: 8:45 AM (UPDATE)   Dennis Black MRN: [id number]  Principal Diagnosis: Paranoia ([place])  Secondary Diagnoses: Active Problems:   MDD (major depressive disorder), single episode, severe with psychosis ([place])   GAD (generalized anxiety disorder)   Nightmares   Insomnia   Cocaine dependence (HCC)   Current Medications:  Current Facility-Administered Medications  Medication Dose Route Frequency Provider Last Rate Last Admin   acetaminophen (TYLENOL) tablet 650 mg  650 mg Oral Q6H PRN Charmaine Downs C, NP   650 mg at 06/30/22 0748   alum & mag hydroxide-simeth (MAALOX/MYLANTA) 200-200-20 MG/5ML suspension 30 mL  30 mL Oral Q4H PRN Charmaine Downs C, NP       escitalopram (LEXAPRO) tablet 10 mg  10 mg Oral Daily Nicholes Rough, NP   10 mg at 07/05/22 0803   fluticasone (FLONASE) 50 MCG/ACT nasal spray 2 spray  2 spray Each Nare Daily Nicholes Rough, NP   2 spray at 07/05/22 6440   hydrOXYzine (ATARAX) tablet 25 mg  25 mg Oral TID PRN Nicholes Rough, NP       insulin aspart (novoLOG) injection 0-9 Units  0-9 Units Subcutaneous TID WC Nkwenti, Doris, NP   1 Units at 07/05/22 1259   loratadine (CLARITIN) tablet 10 mg  10 mg Oral Daily Nicholes Rough, NP   10 mg at 07/05/22 3474   magnesium hydroxide (MILK OF MAGNESIA) suspension 30 mL  30 mL Oral Daily PRN Charmaine Downs C, NP       prazosin (MINIPRESS) capsule 1 mg  1 mg Oral QHS Nkwenti, Doris, NP   1 mg at 07/04/22 2111   risperiDONE (RISPERDAL) tablet 1 mg  1 mg Oral Daily Ranae Palms, MD   1 mg at 07/05/22 0803   risperiDONE (RISPERDAL) tablet 2 mg  2 mg Oral QHS Ranae Palms, MD   2 mg at 07/04/22 2112   traZODone (DESYREL) tablet 50 mg  50 mg Oral QHS Nicholes Rough, NP   50 mg at 07/04/22 2111   PTA Medications: No medications prior to admission.    Patient Stressors:    Patient Strengths:    Treatment Modalities: Medication  Management, Group therapy, Case management,  1 to 1 session with clinician, Psychoeducation, Recreational therapy.   Physician Treatment Plan for Primary Diagnosis: Paranoia ([place]) Long Term Goal(s): Improvement in symptoms so as ready for discharge   Short Term Goals: Ability to identify changes in lifestyle to reduce recurrence of condition will improve Ability to verbalize feelings will improve Ability to demonstrate self-control will improve Compliance with prescribed medications will improve  Medication Management: Evaluate patient's response, side effects, and tolerance of medication regimen.  Therapeutic Interventions: 1 to 1 sessions, Unit Group sessions and Medication administration.  Evaluation of Outcomes: Progressing  Physician Treatment Plan for Secondary Diagnosis: Active Problems:   MDD (major depressive disorder), single episode, severe with psychosis ([place])   GAD (generalized anxiety disorder)   Nightmares   Insomnia   Cocaine dependence ([place])  Long Term Goal(s): Improvement in symptoms so as ready for discharge   Short Term Goals: Ability to identify changes in lifestyle to reduce recurrence of condition will improve Ability to verbalize feelings will improve Ability to demonstrate self-control will improve Compliance with prescribed medications will improve     Medication Management: Evaluate patient's response, side effects, and tolerance of medication regimen.  Therapeutic Interventions: 1 to 1 sessions, Unit Group sessions and Medication administration.  Evaluation of  Outcomes: Progressing   RN Treatment Plan for Primary Diagnosis: Paranoia ([place]) Long Term Goal(s): Knowledge of disease and therapeutic regimen to maintain health will improve  Short Term Goals: Ability to remain free from injury will improve, Ability to verbalize frustration and anger appropriately will improve, Ability to participate in decision making will improve, Ability to verbalize  feelings will improve, Ability to identify and develop effective coping behaviors will improve, and Compliance with prescribed medications will improve  Medication Management: RN will administer medications as ordered by provider, will assess and evaluate patient's response and provide education to patient for prescribed medication. RN will report any adverse and/or side effects to prescribing provider.  Therapeutic Interventions: 1 on 1 counseling sessions, Psychoeducation, Medication administration, Evaluate responses to treatment, Monitor vital signs and CBGs as ordered, Perform/monitor CIWA, COWS, AIMS and Fall Risk screenings as ordered, Perform wound care treatments as ordered.  Evaluation of Outcomes: Progressing   LCSW Treatment Plan for Primary Diagnosis: Paranoia ([place]) Long Term Goal(s): Safe transition to appropriate next level of care at discharge, Engage patient in therapeutic group addressing interpersonal concerns.  Short Term Goals: Engage patient in aftercare planning with referrals and resources, Increase social support, Increase emotional regulation, Facilitate acceptance of mental health diagnosis and concerns, Identify triggers associated with mental health/substance abuse issues, and Increase skills for wellness and recovery  Therapeutic Interventions: Assess for all discharge needs, 1 to 1 time with Social worker, Explore available resources and support systems, Assess for adequacy in community support network, Educate family and significant other(s) on suicide prevention, Complete Psychosocial Assessment, Interpersonal group therapy.  Evaluation of Outcomes: Progressing   Progress in Treatment: Attending groups: Yes. Participating in groups: Yes. Taking medication as prescribed: Yes. Toleration medication: Yes. Family/Significant other contact made: Yes, individual(s) contacted:  son [phone]  Patient understands diagnosis: Yes. Discussing patient identified  problems/goals with staff: Yes. Medical problems stabilized or resolved: Yes. Denies suicidal/homicidal ideation: Yes. Issues/concerns per patient self-inventory: No.  New problem(s) identified: No, Describe:  None    New Short Term/Long Term Goal(s): medication stabilization, elimination of SI thoughts, development of comprehensive mental wellness plan.    Patient Goals: "To get sober from Cocaine"    Discharge Plan or Barriers: Patient recently admitted. CSW will continue to follow and assess for appropriate referrals and possible discharge planning.    Reason for Continuation of Hospitalization: Anxiety Hallucinations Medication stabilization   Estimated Length of Stay: 1-2 days    Last 3 [place] Suicide Severity Risk Score: Flowsheet Row Admission (Current) from 06/29/2022 in [place] [facility] ED from 06/27/2022 in [facility] Emergency Department at [place] No Risk Error: Question 6 not populated       Last [facility] 2/9 Scores:     No data to display          Scribe for Treatment Team: Charlett Lango 07/05/2022 3:49 PM

## 2022-07-05 NOTE — Progress Notes (Signed)
[facility] MD Progress Note  07/05/2022 5:35 PM Dennis Black  MRN:  [id number]  Reason for admission: Dennis Black is a 42 yo Caucasian male with a history of cocaine abuse who was taken to the [facility] health urgent care by [facility] with complaints of paranoia. [employer] was called after pt was found running down a street stating that people are after him, and are trying to kill him. Pt was subsequently transferred voluntarily to this behavioral health Hospital for treatment and stabilization of his mental status.   Chart Reviewed Last 24 hours: vital signs continue to be within normal limits in the past 24 hours.  Patient is continuing to be compliant with medications.  Has not required any PRN medications in the past 24 hrs. Pt is visible on the unit and has been attending unit group sessions. No behavioral episodes noted in the past 24 hrs.  Patient assessment note 07/05/2022: Mood is continuing to improve, and is euthymic today, patient denies SI, denies HI, denies AVH.  Denies paranoia, and there is no evidence of delusional thinking.  He reports a good sleep quality last night, reports a good appetite, verbalizes readiness for discharge, states that his plan is to go reside with his son, until he can get a place of his own.  He states that his family has moved him out of where he was residing, and are working on getting him a place.  He reports having a very good support system outside of the hospital, states that his family and his employer are all supportive and understanding of his mental health needs.  Patient reports today that he works Tuesdays through Sundays from 9 AM to 7 PM and that he is off on Mondays all day, and works on Tuesdays from 9 AM to 12 PM.    Staff at the [facility] health center were able to schedule a follow-up appointment outpatient in coordination with patient's assigned social worker for 08/02/22 for him to see the APP there for continuity of mental  health services outpatient.  Our plan is to discharge patient tomorrow 07/06/2022 pending safety planning being completed by CSW with patient's family.  CSW is in the process of coordinating for patient to have an outpatient program so that he can continue getting group therapy, but the ability of patient to benefit from such groups is a concern at this time, since patient is primarily [place] speaking, and speaks some broken [place].  With patient working every day with the exception of Mondays, it is questionable as to if he will be able to attend such programs as well.  Patient has been educated on the negative impact of substance abuse on his mental health, and has been educated on the need to abstain from the people, places, and things that make him use cocaine, and has verbalized understanding.  He has stated that he would relate the contacts of the people who supply him with a cocaine from his phone, and stay away from [facility] in [place] where he purchases to substance from.  Labs independently reviewed on 07/05/2022: TSH was 4.562 on 02/1.  Ordered free T3, and free T4. FT4 slightly elevated at 1.17. Will need pcp f/u after dc to ascertain if this is significant for hypothyroidism. Hemoglobin A1c is 6.4 rendering patient prediabetic.  Consulted diabetic coordinator to ascertain if patient is to be started on metformin or if he needs to follow up with his PCP after discharge. Her recommendations are as follows:  "  Consider adding Novolog 0-9 units TID  HgbA1C of 6.4% indicates prediabetes. Recommend lifestyle modification with healthy diet, exercise and weight loss.   Will need to f/u with PCP after discharge." Sliding scale placed.  Patient has stated that he does not want insulin anymore, this medication will be discontinued, metformin 500 mg will be ordered, and patient will follow up outpatient with his PCP regarding his prediabetic status.  Patient has been educated on healthy food choices,  and the need to exercise every day.   Lipid panel mostly within normal limits with LDL of 116.  UA turbid in appearance otherwise within normal limits.  BMP WNL and CBC mostly WNL. EKG with Qtc 418.  Principal Problem: Paranoia ([place]) Diagnosis: Active Problems:   MDD (major depressive disorder), single episode, severe with psychosis ([place])   GAD (generalized anxiety disorder)   Nightmares   Insomnia   Cocaine dependence ([place])  Total Time spent with patient: 20 minutes  Past Psychiatric History: See H&P  Past Medical History: History reviewed. No pertinent past medical history. History reviewed. No pertinent surgical history. Family History: History reviewed. No pertinent family history. Family Psychiatric  History: See H&P Social History:  Social History   Substance and Sexual Activity  Alcohol Use None     Social History   Substance and Sexual Activity  Drug Use Yes   Types: Cocaine    Social History   Socioeconomic History   Marital status: Unknown    Spouse name: Not on file   Number of children: Not on file   Years of education: Not on file   Highest education level: Not on file  Occupational History   Not on file  Tobacco Use   Smoking status: Never   Smokeless tobacco: Never  Substance and Sexual Activity   Alcohol use: Not on file   Drug use: Yes    Types: Cocaine   Sexual activity: Not on file  Other Topics Concern   Not on file  Social History Narrative   Not on file   Social Determinants of Health   Financial Resource Strain: Not on file  Food Insecurity: Unknown (06/29/2022)   Hunger Vital Sign    Worried About Running Out of Food in the Last Year: Patient refused    Ran Out of Food in the Last Year: Patient refused  Transportation Needs: No Transportation Needs (06/29/2022)   PRAPARE - Hydrologist (Medical): No    Lack of Transportation (Non-Medical): No  Physical Activity: Not on file  Stress: Not on file   Social Connections: Not on file   Additional Social History:    Attempts were made to contact the patient's son and employer but the phone numbers were not available.  Staff will attempt to get it out of his cell phone today if possible.  Collateral is pending at this time.    Sleep: Fair  Appetite:  Good  Current Medications: Current Facility-Administered Medications  Medication Dose Route Frequency Provider Last Rate Last Admin   acetaminophen (TYLENOL) tablet 650 mg  650 mg Oral Q6H PRN Charmaine Downs C, NP   650 mg at 07/05/22 1710   alum & mag hydroxide-simeth (MAALOX/MYLANTA) 200-200-20 MG/5ML suspension 30 mL  30 mL Oral Q4H PRN Charmaine Downs C, NP       escitalopram (LEXAPRO) tablet 10 mg  10 mg Oral Daily Lesha Jager, NP   10 mg at 07/05/22 0803   fluticasone (FLONASE) 50 MCG/ACT nasal spray  2 spray  2 spray Each Nare Daily Starleen Blue, NP   2 spray at 07/05/22 3762   hydrOXYzine (ATARAX) tablet 25 mg  25 mg Oral TID PRN Starleen Blue, NP       loratadine (CLARITIN) tablet 10 mg  10 mg Oral Daily Starleen Blue, NP   10 mg at 07/05/22 8315   magnesium hydroxide (MILK OF MAGNESIA) suspension 30 mL  30 mL Oral Daily PRN Earney Navy, NP       [START ON 07/06/2022] metFORMIN (GLUCOPHAGE-XR) 24 hr tablet 500 mg  500 mg Oral Q breakfast Joshlynn Alfonzo, NP       ondansetron (ZOFRAN) tablet 4 mg  4 mg Oral Q6H PRN Starleen Blue, NP       prazosin (MINIPRESS) capsule 1 mg  1 mg Oral QHS Treyven Lafauci, NP   1 mg at 07/04/22 2111   risperiDONE (RISPERDAL) tablet 1 mg  1 mg Oral Daily Rex Kras, MD   1 mg at 07/05/22 1761   risperiDONE (RISPERDAL) tablet 2 mg  2 mg Oral QHS Rex Kras, MD   2 mg at 07/04/22 2112   traZODone (DESYREL) tablet 50 mg  50 mg Oral QHS Starleen Blue, NP   50 mg at 07/04/22 2111    Lab Results:  Results for orders placed or performed during the hospital encounter of 06/29/22 (from the past 48 hour(s))  Glucose, capillary      Status: Abnormal   Collection Time: 07/04/22  5:18 PM  Result Value Ref Range   Glucose-Capillary 277 (H) 70 - 99 mg/dL    Comment: Glucose reference range applies only to samples taken after fasting for at least 8 hours.  Glucose, capillary     Status: Abnormal   Collection Time: 07/04/22  8:39 PM  Result Value Ref Range   Glucose-Capillary 247 (H) 70 - 99 mg/dL    Comment: Glucose reference range applies only to samples taken after fasting for at least 8 hours.  Glucose, capillary     Status: Abnormal   Collection Time: 07/05/22  6:19 AM  Result Value Ref Range   Glucose-Capillary 166 (H) 70 - 99 mg/dL    Comment: Glucose reference range applies only to samples taken after fasting for at least 8 hours.  Glucose, capillary     Status: Abnormal   Collection Time: 07/05/22 11:57 AM  Result Value Ref Range   Glucose-Capillary 136 (H) 70 - 99 mg/dL    Comment: Glucose reference range applies only to samples taken after fasting for at least 8 hours.  Glucose, capillary     Status: Abnormal   Collection Time: 07/05/22  5:07 PM  Result Value Ref Range   Glucose-Capillary 235 (H) 70 - 99 mg/dL    Comment: Glucose reference range applies only to samples taken after fasting for at least 8 hours.     Blood Alcohol level:  Lab Results  Component Value Date   ETH <10 06/27/2022    Metabolic Disorder Labs: Lab Results  Component Value Date   HGBA1C 6.4 (H) 07/01/2022   MPG 136.98 07/01/2022   No results found for: "PROLACTIN" Lab Results  Component Value Date   CHOL 169 07/01/2022   TRIG 114 07/01/2022   HDL 30 (L) 07/01/2022   CHOLHDL 5.6 07/01/2022   VLDL 23 07/01/2022   LDLCALC 116 (H) 07/01/2022    Physical Findings: AIMS:  , ,  ,  ,    CIWA:    COWS:  Musculoskeletal: Strength & Muscle Tone: within normal limits Gait & Station: normal Patient leans: N/A  Psychiatric Specialty Exam:  Presentation  General Appearance:  Appropriate for Environment; Fairly  Groomed  Eye Contact: Fair  Speech: Clear and Coherent  Speech Volume: Normal  Handedness: Right   Mood and Affect  Mood: Euthymic  Affect: Congruent   Thought Process  Thought Processes: Coherent  Descriptions of Associations:Intact  Orientation:Full (Time, Place and Person)  Thought Content:Logical  History of Schizophrenia/Schizoaffective disorder:Yes  Duration of Psychotic Symptoms:Less than six months  Hallucinations:Hallucinations: None  Ideas of Reference:None  Suicidal Thoughts:Suicidal Thoughts: No  Homicidal Thoughts:Homicidal Thoughts: No   Sensorium  Memory: Immediate Good  Judgment: Fair  Insight: Fair   Community education officer  Concentration: Good  Attention Span: Good  Recall: Good  Fund of Knowledge: Good  Language: Good   Psychomotor Activity  Psychomotor Activity: Psychomotor Activity: Normal   Assets  Assets: Communication Skills   Sleep  Sleep: Sleep: Good    Physical Exam: Physical Exam HENT:     Head: Normocephalic.     Nose: Nose normal.  Neurological:     Mental Status: He is oriented to person, place, and time.    Review of Systems  Psychiatric/Behavioral:  Positive for depression and substance abuse. Negative for hallucinations, memory loss and suicidal ideas. The patient is nervous/anxious and has insomnia.   All other systems reviewed and are negative.  Blood pressure 126/69, pulse 70, temperature 98 F (36.7 C), temperature source Oral, resp. rate 20, height 5\' 6"  (1.676 m), weight 90.3 kg, SpO2 98 %. Body mass index is 32.12 kg/m.  Treatment Plan Summary: Daily contact with patient to assess and evaluate symptoms and progress in treatment, Medication management, and Plan   ASSESSMENT:  Diagnoses / Active Problems: Active Problems:   MDD (major depressive disorder), single episode, severe with psychosis ([place])   GAD (generalized anxiety disorder)   Nightmares   Insomnia    Cocaine dependence (HCC)  PLAN: Safety and Monitoring:  --  Voluntary admission to inpatient psychiatric unit for safety, stabilization and treatment  -- Daily contact with patient to assess and evaluate symptoms and progress in treatment  -- Patient's case to be discussed in multi-disciplinary team meeting  -- Observation Level : q15 minute checks  -- Vital signs:  q12 hours  -- Precautions: suicide, elopement, and assault  2. Psychiatric Diagnoses and Treatment:   Active Problems:   MDD (major depressive disorder), single episode, severe with psychosis ([place])   GAD (generalized anxiety disorder)   Nightmares   Insomnia   Cocaine dependence ([place]) --  The risks/benefits/side-effects/alternatives to this medication were discussed in detail with the patient and time was given for questions. The patient consents to medication trial.  -- FDA approved medication to include : -Start Metformin 500 mg daily for prediabetes -Continue Risperdal 1 mg in the morning and 2 mg at night. -Continue Lexapro 10 mg a day for depressive symptoms -Continue trazodone 50 mg as needed at night, change to scheduled medication -Continue Minipress 1 mg at night for nightmares. -Continue Flonase daily for nasal stuffiness -Continue Claritin daily for seasonal allergic rhinitis -Discontinue Sliding Scale Novolog inslin as per pt preference (recommendations by Diabetic consultant)  -- Metabolic profile and EKG monitoring obtained while on an atypical antipsychotic (BMI: Lipid Panel: HbgA1c: QTc:) done  -- Encouraged patient to participate in unit milieu and in scheduled group therapies   -- Short Term Goals: Ability to identify changes in lifestyle to reduce  recurrence of condition will improve, Ability to verbalize feelings will improve, Ability to demonstrate self-control will improve, and Compliance with prescribed medications will improve  -- Long Term Goals: Improvement in symptoms so as ready for  discharge   3. Medical Issues Being Addressed:   Tobacco Use Disorder  -- Nicotine patch 21mg /24 hours ordered  -- Smoking cessation encouraged  4. Discharge Planning:   -- Social work and case management to assist with discharge planning and identification of hospital follow-up needs prior to discharge  -- Estimated LOS: 3 to 4 days.  -- Discharge Concerns: Need to establish a safety plan; Medication compliance and effectiveness  -- Discharge Goals: Return home with outpatient referrals for mental health follow-up including medication management/psychotherapy  Nicholes Rough, NP 07/05/2022, 5:35 PM   Patient ID: Lyndell Allaire, male   DOB: [birth year], 42 y.o.   MRN: [id number] Patient ID: Azariel Banik, male   DOB: [birth year], 42 y.o.   MRN: [id number] Patient ID: Sherwin Hollingshed, male   DOB: Oct 16, 1980, 42 y.o.   MRN: [id number] Patient ID: Fabiano Ginley, male   DOB: 06-Oct-1980, 42 y.o.   MRN: [id number]

## 2022-07-06 DIAGNOSIS — F22 Delusional disorders: Secondary | ICD-10-CM | POA: Diagnosis not present

## 2022-07-06 LAB — VITAMIN B1: Vitamin B1 (Thiamine): 112.5 nmol/L (ref 66.5–200.0)

## 2022-07-06 MED ORDER — LORATADINE 10 MG PO TABS: 10.0000 mg | ORAL_TABLET | Freq: Every day | ORAL | Status: AC

## 2022-07-06 MED ORDER — ESCITALOPRAM OXALATE 10 MG PO TABS: 10.0000 mg | ORAL_TABLET | Freq: Every day | ORAL | 0 refills | Status: AC

## 2022-07-06 MED ORDER — RISPERIDONE 2 MG PO TABS: 2.0000 mg | ORAL_TABLET | Freq: Every day | ORAL | 0 refills | Status: AC

## 2022-07-06 MED ORDER — TRAZODONE HCL 50 MG PO TABS: 50.0000 mg | ORAL_TABLET | Freq: Every day | ORAL | 0 refills | Status: AC

## 2022-07-06 MED ORDER — FLUTICASONE PROPIONATE 50 MCG/ACT NA SUSP: 2.0000 | Freq: Every day | NASAL | 0 refills | Status: AC

## 2022-07-06 MED ORDER — METFORMIN HCL ER 500 MG PO TB24: 500.0000 mg | ORAL_TABLET | Freq: Every day | ORAL | 0 refills | Status: AC

## 2022-07-06 MED ORDER — PRAZOSIN HCL 1 MG PO CAPS: 1.0000 mg | ORAL_CAPSULE | Freq: Every day | ORAL | 0 refills | Status: AC

## 2022-07-06 MED ORDER — RISPERIDONE 1 MG PO TABS: 1.0000 mg | ORAL_TABLET | Freq: Every day | ORAL | 0 refills | Status: AC

## 2022-07-06 NOTE — Progress Notes (Signed)
Pt discharged to lobby. Pt was stable and appreciative at that time. All papers, samples, and prescriptions were given and valuables returned. Suicide safety plan completed and copy placed in chart. Verbal understanding expressed. Denies SI/HI and A/VH. Pt given opportunity to express concerns and ask questions.

## 2022-07-06 NOTE — Progress Notes (Addendum)
D. Pt is bright upon approach- presents with a bright affect and voices no complaints. Per pt's self inventory, pt rated his depression,hopelessness and anxiety all 0's. Pt currently denies SI/HI and AVH A. Labs and vitals monitored. Pt given and educated on medications. Pt supported emotionally and encouraged to express concerns and ask questions.   R. Pt remains safe with 15 minute checks. Will continue POC.    07/06/22 1000  Psych Admission Type (Psych Patients Only)  Admission Status Voluntary  Psychosocial Assessment  Patient Complaints None  Eye Contact Fair  Facial Expression Animated  Affect Appropriate to circumstance  Speech Logical/coherent  Interaction Assertive  Motor Activity Other (Comment) (steady gait)  Appearance/Hygiene Unremarkable  Behavior Characteristics Cooperative;Appropriate to situation  Mood Pleasant  Aggressive Behavior  Effect No apparent injury  Thought Process  Coherency WDL  Content WDL  Delusions None reported or observed  Perception WDL  Hallucination None reported or observed  Judgment Limited  Confusion None  Danger to Self  Current suicidal ideation? Denies  Agreement Not to Harm Self Yes  Danger to Others  Danger to Others None reported or observed

## 2022-07-06 NOTE — Progress Notes (Signed)
  [facility] Adult Case Management Discharge Plan :  Will you be returning to the same living situation after discharge:  Yes,  home At discharge, do you have transportation home?: Yes,  son will be picking patient up Do you have the ability to pay for your medications: Yes,  resources for no insurance   Release of information consent forms completed and in the chart;  Patient's signature needed at discharge.  Patient to Follow up at:  [facility]. Go on 08/02/2022.   Specialty: Behavioral Health Why: You have an appointment for medication management services on on 08/02/22 at 1:00 pm.  Also, for therapy services please go to this provider for an assessment including substance use services on Monday through Friday, arrive by 7:20 am.  Assessments are provided on a first come, first served basis.  Interpreter services are available. Contact information: [place] [phone]        [facility], [facility]. Go to.   Why: This is a 28 day residential program that you can go to Monday through Thursday at [place].  They will do an assessment and see if you are appropriate for admission to their program. Contact information: [address] [place] [address] [phone]                 Next level of care provider has access to [place] and Suicide Prevention discussed: Yes,  son      Has patient been referred to the Quitline?: N/A patient is not a smoker  Patient has been referred for addiction treatment: Yes, [place] and informtation for [facility], barrier of spanish speaking preventing him from participating in [place] programs.  Cniyah Sproull E Wymon Swaney, LCSW 07/06/2022, 9:46 AM

## 2022-07-06 NOTE — Discharge Summary (Signed)
Physician Discharge Summary Note  Patient:  Dennis Black is an 42 y.o., male MRN:  [id number] DOB:  09-05-1980 Patient phone:  [phone] (home)  Patient address:   [address] [place] [address],  Total Time spent with patient: 30 minutes  Date of Admission:  06/29/2022 Date of Discharge: 07/06/2022  Reason for Admission: Dennis Black is a 42 yo Caucasian male with a history of cocaine abuse who was taken to the [facility] health urgent care by [facility] with complaints of paranoia. [employer] was called after pt was found running down a street stating that people are after him, and are trying to kill him. Pt was subsequently transferred voluntarily to this behavioral health Hospital for treatment and stabilization of his mental status.   Principal Problem: Paranoia [facility]) Discharge Diagnoses: Active Problems:   MDD (major depressive disorder), single episode, severe with psychosis (HCC)   GAD (generalized anxiety disorder)   Nightmares   Insomnia   Cocaine dependence (HCC)  Past Psychiatric History: See H & P  Past Medical History: History reviewed. No pertinent past medical history. History reviewed. No pertinent surgical history. Family History: History reviewed. No pertinent family history. Family Psychiatric  History: See H & P Social History:  Social History   Substance and Sexual Activity  Alcohol Use None     Social History   Substance and Sexual Activity  Drug Use Yes   Types: Cocaine    Social History   Socioeconomic History   Marital status: Unknown    Spouse name: Not on file   Number of children: Not on file   Years of education: Not on file   Highest education level: Not on file  Occupational History   Not on file  Tobacco Use   Smoking status: Never   Smokeless tobacco: Never  Substance and Sexual Activity   Alcohol use: Not on file   Drug use: Yes    Types: Cocaine   Sexual activity: Not on file  Other Topics Concern   Not  on file  Social History Narrative   Not on file   Social Determinants of Health   Financial Resource Strain: Not on file  Food Insecurity: Unknown (06/29/2022)   Hunger Vital Sign    Worried About Running Out of Food in the Last Year: Patient refused    Ran Out of Food in the Last Year: Patient refused  Transportation Needs: No Transportation Needs (06/29/2022)   PRAPARE - Administrator, Civil Service (Medical): No    Lack of Transportation (Non-Medical): No  Physical Activity: Not on file  Stress: Not on file  Social Connections: Not on file                                       HOSPITAL COURSE During the patient's hospitalization, patient had extensive initial psychiatric evaluation, and follow-up psychiatric evaluations every day. Psychiatric diagnoses provided upon initial assessment are as above. Patient's psychiatric medications were adjusted on admission as follows: -Discontinued Zyprexa -Start Risperdal 1 mg twice daily for psychosis -Start Lexapro 10 mg daily for depressive symptoms/anxiety -Start hydroxyzine 25 mg 3 times daily as needed for anxiety -Start trazodone 50 mg as needed nightly for sleep -Start Minipress 1 mg nightly for nightmares   During the hospitalization, other adjustments were made to the patient's psychiatric medication regimen. Medications at discharge are as follows:   -Continue  Metformin 500 mg daily for prediabetes/weight gain prophylaxis related to antipsychotics -Continue Risperdal 1 mg in the morning and 2 mg at night. -Continue Lexapro 10 mg daily for depressive symptoms -Continue trazodone 50 mg as needed at night, change to scheduled medication -Continue Minipress 1 mg at night for nightmares. -Continue Flonase daily for nasal stuffiness -Continue Claritin daily for seasonal allergic rhinitis   Patient's care was discussed during the interdisciplinary team meeting every day during the hospitalization. The patient denies having  side effects to prescribed psychiatric medication. Gradually, patient started adjusting to milieu. The patient was evaluated each day by a clinical provider to ascertain response to treatment. Improvement was noted by the patient's report of decreasing symptoms, improved sleep and appetite, affect, medication tolerance, behavior, and participation in unit programming.  Patient was asked each day to complete a self inventory noting mood, mental status, pain, new symptoms, anxiety and concerns.     Symptoms were reported as significantly decreased or resolved completely by discharge. On day of discharge, the patient reports that their mood is stable. The patient denied having suicidal thoughts for more than 48 hours prior to discharge.  Patient denies having homicidal thoughts.  Patient denies having auditory hallucinations.  Patient denies any visual hallucinations or other symptoms of psychosis. The patient was motivated to continue taking medication with a goal of continued improvement in mental health.    The patient reports their target psychiatric symptoms of depression, psychosis, anxiety and insomnia responded well to the psychiatric medications, and the patient reports overall benefit from this psychiatric hospitalization. Supportive psychotherapy was provided to the patient. The patient also participated in regular group therapy while hospitalized. Coping skills, problem solving as well as relaxation therapies were also part of the unit programming.   Labs were reviewed with the patient, and abnormal results were discussed with the patient.Lipid panel mostly within normal limits with LDL of 116. UA turbid in appearance otherwise within normal limits. BMP WNL and CBC mostly WNL. EKG with Qtc 418.  HgbA1C of 6.4% indicates prediabetes. Recommended lifestyle modification with healthy diet, exercise and weight loss. Pt verbalized understanding. Educated to f/u with the [facility] and wellness clinic after  discharge.   The patient is able to verbalize their individual safety plan to this provider.   # It is recommended to the patient to continue psychiatric medications as prescribed, after discharge from the hospital.     # It is recommended to the patient to follow up with your outpatient psychiatric provider and PCP.   # It was discussed with the patient, the impact of alcohol, drugs, tobacco have been there overall psychiatric and medical wellbeing, and total abstinence from substance use was recommended the patient.ed.   # Prescriptions provided or sent directly to preferred pharmacy at discharge. Patient agreeable to plan. Given opportunity to ask questions. Appears to feel comfortable with discharge.    # In the event of worsening symptoms, the patient is instructed to call the crisis hotline (988), 911 and or go to the nearest ED for appropriate evaluation and treatment of symptoms. To follow-up with primary care provider for other medical issues, concerns and or health care needs   # Patient was discharged home with a plan to follow up as noted below.   Physical Findings: AIMS: 0 CIWA: n/a COWS: n/a   Musculoskeletal: Strength & Muscle Tone: within normal limits Gait & Station: normal Patient leans: N/A   Psychiatric Specialty Exam:  Presentation  General Appearance:  Appropriate  for Environment; Fairly Groomed  Eye Contact: Good  Speech: Clear and Coherent  Speech Volume: Normal  Handedness: Right   Mood and Affect  Mood: Euthymic  Affect: Congruent   Thought Process  Thought Processes: Coherent  Descriptions of Associations:Intact  Orientation:Full (Time, Place and Person)  Thought Content:Logical  History of Schizophrenia/Schizoaffective disorder:No  Duration of Psychotic Symptoms:Less than six months  Hallucinations:Hallucinations: None  Ideas of Reference:None  Suicidal Thoughts:Suicidal Thoughts: No  Homicidal Thoughts:Homicidal  Thoughts: No   Sensorium  Memory: Immediate Good  Judgment: Good  Insight: Fair   Community education officer  Concentration: Good  Attention Span: Good  Recall: Good  Fund of Knowledge: Good  Language: Good   Psychomotor Activity  Psychomotor Activity: Psychomotor Activity: Normal   Assets  Assets: Communication Skills   Sleep  Sleep: Sleep: Good    Physical Exam: Physical Exam Review of Systems  Constitutional: Negative.   HENT: Negative.    Eyes: Negative.   Respiratory: Negative.    Cardiovascular: Negative.   Gastrointestinal: Negative.   Genitourinary: Negative.   Musculoskeletal: Negative.   Skin: Negative.   Neurological: Negative.   Psychiatric/Behavioral:  Positive for depression (Denies SI, denies HI/AVH and verbally contracts for safety outside of this [facility]) and substance abuse (Educated on substance use cessation along with the negative impact on his mental health). Negative for hallucinations, memory loss and suicidal ideas. The patient is nervous/anxious (resolving on current medications) and has insomnia (resolving on current medications).    Blood pressure 132/71, pulse 91, temperature 97.9 F (36.6 C), temperature source Oral, resp. rate 20, height 5\' 6"  (1.676 m), weight 90.3 kg, SpO2 96 %. Body mass index is 32.12 kg/m.   Social History   Tobacco Use  Smoking Status Never  Smokeless Tobacco Never   Tobacco Cessation:  N/A, patient does not currently use tobacco products   Blood Alcohol level:  Lab Results  Component Value Date   ETH <10 85/27/7824    Metabolic Disorder Labs:  Lab Results  Component Value Date   HGBA1C 6.4 (H) 07/01/2022   MPG 136.98 07/01/2022   No results found for: "PROLACTIN" Lab Results  Component Value Date   CHOL 169 07/01/2022   TRIG 114 07/01/2022   HDL 30 (L) 07/01/2022   CHOLHDL 5.6 07/01/2022   VLDL 23 07/01/2022   LDLCALC 116 (H) 07/01/2022    See Psychiatric Specialty Exam  and Suicide Risk Assessment completed by Attending Physician prior to discharge.  Discharge destination:  Home  Is patient on multiple antipsychotic therapies at discharge:  No   Has Patient had three or more failed trials of antipsychotic monotherapy by history:  No  Recommended Plan for Multiple Antipsychotic Therapies: NA  Discharge Instructions     [facility] pharmacy consult for medication samples   Complete by: As directed    No insurance, needs 7 days samples of meds      Allergies as of 07/06/2022   No Known Allergies      Medication List     TAKE these medications      Indication  escitalopram 10 MG tablet Commonly known as: LEXAPRO Take 1 tablet (10 mg total) by mouth daily. Start taking on: July 07, 2022  Indication: Major Depressive Disorder   fluticasone 50 MCG/ACT nasal spray Commonly known as: FLONASE Place 2 sprays into both nostrils daily. Start taking on: July 07, 2022  Indication: Allergic Rhinitis   loratadine 10 MG tablet Commonly known as: CLARITIN Take 1 tablet (10  mg total) by mouth daily. Start taking on: July 07, 2022  Indication: Hayfever   metFORMIN 500 MG 24 hr tablet Commonly known as: GLUCOPHAGE-XR Take 1 tablet (500 mg total) by mouth daily with breakfast. Start taking on: July 07, 2022  Indication: Antipsychotic Therapy-Induced Weight Gain, Type 2 Diabetes   prazosin 1 MG capsule Commonly known as: MINIPRESS Take 1 capsule (1 mg total) by mouth at bedtime.  Indication: Frightening Dreams   risperiDONE 2 MG tablet Commonly known as: RISPERDAL Take 1 tablet (2 mg total) by mouth at bedtime.  Indication: Major Depressive Disorder, MDD/psychosis   risperiDONE 1 MG tablet Commonly known as: RISPERDAL Take 1 tablet (1 mg total) by mouth daily. Take in the morning Start taking on: July 07, 2022  Indication: Major Depressive Disorder, psychosis   traZODone 50 MG tablet Commonly known as: DESYREL Take 1 tablet (50 mg  total) by mouth at bedtime.  Indication: [place]. Go on 08/02/2022.   Specialty: Behavioral Health Why: You have an appointment for medication management services on on 08/02/22 at 1:00 pm.  Also, for therapy services please go to this provider for an assessment including substance use services on Monday through Friday, arrive by 7:20 am.  Assessments are provided on a first come, first served basis.  Interpreter services are available. Contact information: [place] [phone]        Services, [facility]. Go to.   Why: This is a 28 day residential program that you can go to Monday through Thursday at [place].  They will do an assessment and see if you are appropriate for admission to their program. Contact information: [address] [place] [address] [phone]         [facility]. Schedule an appointment as soon as possible for a visit in 2 day(s).   Contact information: [place] [address] [phone]                Signed: Nicholes Rough, NP 07/06/2022, 3:31 PM

## 2022-07-06 NOTE — Progress Notes (Signed)

## 2022-07-06 NOTE — BHH Suicide Risk Assessment (Signed)
Suicide Risk Assessment  Discharge Assessment    [facility] Discharge Suicide Risk Assessment   Principal Problem: Paranoia [facility]) Discharge Diagnoses: Active Problems:   MDD (major depressive disorder), single episode, severe with psychosis ([place])   GAD (generalized anxiety disorder)   Nightmares   Insomnia   Cocaine dependence ([place])  Reason for admission: Dennis Black is a 42 yo Caucasian male with a history of cocaine abuse who was taken to the [facility] health urgent care by [facility] with complaints of paranoia. [employer] was called after pt was found running down a street stating that people are after him, and are trying to kill him. Pt was subsequently transferred voluntarily to this behavioral health Hospital for treatment and stabilization of his mental status.                                    HOSPITAL COURSE During the patient's hospitalization, patient had extensive initial psychiatric evaluation, and follow-up psychiatric evaluations every day. Psychiatric diagnoses provided upon initial assessment are as above. Patient's psychiatric medications were adjusted on admission as follows: -Discontinued Zyprexa -Start Risperdal 1 mg twice daily for psychosis -Start Lexapro 10 mg daily for depressive symptoms/anxiety -Start hydroxyzine 25 mg 3 times daily as needed for anxiety -Start trazodone 50 mg as needed nightly for sleep -Start Minipress 1 mg nightly for nightmares  During the hospitalization, other adjustments were made to the patient's psychiatric medication regimen. Medications at discharge are as follows:  -Continue Metformin 500 mg daily for prediabetes/weight gain prophylaxis related to antipsychotics -Continue Risperdal 1 mg in the morning and 2 mg at night. -Continue Lexapro 10 mg daily for depressive symptoms -Continue trazodone 50 mg as needed at night, change to scheduled medication -Continue Minipress 1 mg at night for nightmares. -Continue Flonase daily for  nasal stuffiness -Continue Claritin daily for seasonal allergic rhinitis  Patient's care was discussed during the interdisciplinary team meeting every day during the hospitalization. The patient denies having side effects to prescribed psychiatric medication. Gradually, patient started adjusting to milieu. The patient was evaluated each day by a clinical provider to ascertain response to treatment. Improvement was noted by the patient's report of decreasing symptoms, improved sleep and appetite, affect, medication tolerance, behavior, and participation in unit programming.  Patient was asked each day to complete a self inventory noting mood, mental status, pain, new symptoms, anxiety and concerns.    Symptoms were reported as significantly decreased or resolved completely by discharge. On day of discharge, the patient reports that their mood is stable. The patient denied having suicidal thoughts for more than 48 hours prior to discharge.  Patient denies having homicidal thoughts.  Patient denies having auditory hallucinations.  Patient denies any visual hallucinations or other symptoms of psychosis. The patient was motivated to continue taking medication with a goal of continued improvement in mental health.   The patient reports their target psychiatric symptoms of depression, psychosis, anxiety and insomnia responded well to the psychiatric medications, and the patient reports overall benefit from this psychiatric hospitalization. Supportive psychotherapy was provided to the patient. The patient also participated in regular group therapy while hospitalized. Coping skills, problem solving as well as relaxation therapies were also part of the unit programming.  Labs were reviewed with the patient, and abnormal results were discussed with the patient.Lipid panel mostly within normal limits with LDL of 116. UA turbid in appearance otherwise within normal limits. BMP WNL  and CBC mostly WNL. EKG with Qtc 418.   HgbA1C of 6.4% indicates prediabetes. Recommended lifestyle modification with healthy diet, exercise and weight loss. Pt verbalized understanding. Educated to f/u with the [facility] and wellness clinic after discharge.  The patient is able to verbalize their individual safety plan to this provider.  # It is recommended to the patient to continue psychiatric medications as prescribed, after discharge from the hospital.    # It is recommended to the patient to follow up with your outpatient psychiatric provider and PCP.  # It was discussed with the patient, the impact of alcohol, drugs, tobacco have been there overall psychiatric and medical wellbeing, and total abstinence from substance use was recommended the patient.ed.  # Prescriptions provided or sent directly to preferred pharmacy at discharge. Patient agreeable to plan. Given opportunity to ask questions. Appears to feel comfortable with discharge.    # In the event of worsening symptoms, the patient is instructed to call the crisis hotline (988), 911 and or go to the nearest ED for appropriate evaluation and treatment of symptoms. To follow-up with primary care provider for other medical issues, concerns and or health care needs  # Patient was discharged home with a plan to follow up as noted below.   Total Time spent with patient: 30 minutes  Musculoskeletal: Strength & Muscle Tone: within normal limits Gait & Station: normal Patient leans: N/A  Psychiatric Specialty Exam  Presentation  General Appearance:  Appropriate for Environment; Fairly Groomed  Eye Contact: Good  Speech: Clear and Coherent  Speech Volume: Normal  Handedness: Right   Mood and Affect  Mood: Euthymic  Duration of Depression Symptoms: No data recorded Affect: Congruent   Thought Process  Thought Processes: Coherent  Descriptions of Associations:Intact  Orientation:Full (Time, Place and Person)  Thought Content:Logical  History  of Schizophrenia/Schizoaffective disorder:No  Duration of Psychotic Symptoms:Less than six months  Hallucinations:Hallucinations: None  Ideas of Reference:None  Suicidal Thoughts:Suicidal Thoughts: No  Homicidal Thoughts:Homicidal Thoughts: No   Sensorium  Memory: Immediate Good  Judgment: Good  Insight: Fair   Community education officer  Concentration: Good  Attention Span: Good  Recall: Good  Fund of Knowledge: Good  Language: Good   Psychomotor Activity  Psychomotor Activity: Psychomotor Activity: Normal   Assets  Assets: Communication Skills   Sleep  Sleep: Sleep: Good   Physical Exam: Physical Exam Constitutional:      Appearance: Normal appearance.  HENT:     Head: Normocephalic.  Eyes:     Pupils: Pupils are equal, round, and reactive to light.  Musculoskeletal:        General: Normal range of motion.     Cervical back: Normal range of motion.  Neurological:     Mental Status: He is alert and oriented to person, place, and time.  Psychiatric:        Behavior: Behavior normal.    Review of Systems  Constitutional:  Negative for fever.  HENT: Negative.    Eyes: Negative.   Respiratory: Negative.    Cardiovascular: Negative.   Gastrointestinal: Negative.  Negative for heartburn.  Genitourinary: Negative.   Musculoskeletal: Negative.   Skin: Negative.   Psychiatric/Behavioral:  Positive for depression (Denies SI/HI/AVH, verbally contracts for safety outside of this [facility]) and substance abuse (Educated on substance use cessation and verbalizes understanding, educated on the negative impact on his mental health). Negative for hallucinations, memory loss and suicidal ideas. The patient is nervous/anxious (resolving on current meds) and has insomnia (  resolving on current meds).    Blood pressure 132/71, pulse 91, temperature 97.9 F (36.6 C), temperature source Oral, resp. rate 20, height 5\' 6"  (1.676 m), weight 90.3 kg, SpO2 96 %.  Body mass index is 32.12 kg/m.  Mental Status Per Nursing Assessment::   On Admission:  NA  Demographic Factors:  Male and Low socioeconomic status  Loss Factors: NA  Historical Factors: NA  Risk Reduction Factors:   Employed and Positive social support  Continued Clinical Symptoms:  Patient reports that his psychosis has resolved. He denies SI, denies HI, denies AVH, no evidence delusional thinking. Verbally contracting for safety outside [facility] health.  Cognitive Features That Contribute To Risk:  None    Suicide Risk:  Mild:  There are no identifiable suicide plans, no associated intent, mild dysphoria and related symptoms, good self-control (both objective and subjective assessment), few other risk factors, and identifiable protective factors, including available and accessible social support.    [place]. Go on 08/02/2022.   Specialty: Behavioral Health Why: You have an appointment for medication management services on on 08/02/22 at 1:00 pm.  Also, for therapy services please go to this provider for an assessment including substance use services on Monday through Friday, arrive by 7:20 am.  Assessments are provided on a first come, first served basis.  Interpreter services are available. Contact information: [place] [phone]        [facility], [facility]. Go to.   Why: This is a 28 day residential program that you can go to Monday through Thursday at [place].  They will do an assessment and see if you are appropriate for admission to their program. Contact information: [address] [place] [address] [phone]         [facility]. Schedule an appointment as soon as possible for a visit in 2 day(s).   Contact information: [place] [address] [place] [address] [phone]                Nicholes Rough, NP 07/06/2022, 11:00 AM

## 2022-08-02 ENCOUNTER — Ambulatory Visit (HOSPITAL_COMMUNITY): Payer: Self-pay | Admitting: Psychiatry

## 2022-08-02 ENCOUNTER — Encounter (HOSPITAL_COMMUNITY): Payer: Self-pay

## 2022-08-11 ENCOUNTER — Ambulatory Visit (HOSPITAL_COMMUNITY): Payer: Federal, State, Local not specified - Other | Admitting: Student

## 2023-12-15 ENCOUNTER — Encounter (HOSPITAL_COMMUNITY): Admission: EM | Disposition: A | Discharge status: Home / Self Care | Payer: Self-pay | Source: Home / Self Care

## 2023-12-15 ENCOUNTER — Encounter (HOSPITAL_COMMUNITY): Payer: Self-pay

## 2023-12-15 ENCOUNTER — Observation Stay (HOSPITAL_COMMUNITY): Payer: MEDICAID | Admitting: Certified Registered"

## 2023-12-15 ENCOUNTER — Emergency Department (HOSPITAL_COMMUNITY): Payer: Self-pay

## 2023-12-15 ENCOUNTER — Other Ambulatory Visit: Payer: Self-pay

## 2023-12-15 ENCOUNTER — Inpatient Hospital Stay (HOSPITAL_COMMUNITY)
Admission: EM | Admit: 2023-12-15 | Discharge: 2023-12-17 | DRG: 638 | Disposition: A | Discharge status: Home / Self Care | Payer: MEDICAID | Attending: Surgery | Admitting: Surgery

## 2023-12-15 DIAGNOSIS — K6139 Other ischiorectal abscess: Principal | ICD-10-CM | POA: Diagnosis present

## 2023-12-15 DIAGNOSIS — Z794 Long term (current) use of insulin: Secondary | ICD-10-CM

## 2023-12-15 DIAGNOSIS — K5900 Constipation, unspecified: Secondary | ICD-10-CM | POA: Diagnosis present

## 2023-12-15 DIAGNOSIS — K610 Anal abscess: Principal | ICD-10-CM

## 2023-12-15 DIAGNOSIS — E11628 Type 2 diabetes mellitus with other skin complications: Principal | ICD-10-CM | POA: Diagnosis present

## 2023-12-15 DIAGNOSIS — K611 Rectal abscess: Secondary | ICD-10-CM | POA: Diagnosis present

## 2023-12-15 DIAGNOSIS — D8481 Immunodeficiency due to conditions classified elsewhere: Secondary | ICD-10-CM | POA: Diagnosis present

## 2023-12-15 DIAGNOSIS — Z23 Encounter for immunization: Secondary | ICD-10-CM

## 2023-12-15 DIAGNOSIS — E1165 Type 2 diabetes mellitus with hyperglycemia: Secondary | ICD-10-CM | POA: Diagnosis present

## 2023-12-15 HISTORY — DX: Type 2 diabetes mellitus without complications: E11.9

## 2023-12-15 HISTORY — PX: INCISION AND DRAINAGE PERIRECTAL ABSCESS: SHX1804

## 2023-12-15 LAB — COMPREHENSIVE METABOLIC PANEL WITH GFR
ALT: 37 U/L (ref 0–44)
AST: 22 U/L (ref 15–41)
Albumin: 4.4 g/dL (ref 3.5–5.0)
Alkaline Phosphatase: 101 U/L (ref 38–126)
Anion gap: 10 (ref 5–15)
BUN: 15 mg/dL (ref 6–20)
CO2: 25 mmol/L (ref 22–32)
Calcium: 9.3 mg/dL (ref 8.9–10.3)
Chloride: 98 mmol/L (ref 98–111)
Creatinine, Ser: 0.71 mg/dL (ref 0.61–1.24)
GFR, Estimated: >60 mL/min (ref >60)
Glucose, Bld: 282 mg/dL — ABNORMAL HIGH (ref 70–99)
Potassium: 3.8 mmol/L (ref 3.5–5.1)
Sodium: 133 mmol/L — ABNORMAL LOW (ref 135–145)
Total Bilirubin: 1 mg/dL (ref 0.0–1.2)
Total Protein: 7.8 g/dL (ref 6.5–8.1)

## 2023-12-15 LAB — CBC WITH DIFFERENTIAL/PLATELET
Abs Immature Granulocytes: 0.06 K/uL (ref 0.00–0.07)
Basophils Absolute: 0 K/uL (ref 0.0–0.1)
Basophils Relative: 0 %
Eosinophils Absolute: 0 K/uL (ref 0.0–0.5)
Eosinophils Relative: 1 %
HCT: 46.9 % (ref 39.0–52.0)
Hemoglobin: 15.5 g/dL (ref 13.0–17.0)
Immature Granulocytes: 1 %
Lymphocytes Relative: 16 %
Lymphs Abs: 1.4 K/uL (ref 0.7–4.0)
MCH: 27.9 pg (ref 26.0–34.0)
MCHC: 33 g/dL (ref 30.0–36.0)
MCV: 84.4 fL (ref 80.0–100.0)
Monocytes Absolute: 0.5 K/uL (ref 0.1–1.0)
Monocytes Relative: 6 %
Neutro Abs: 6.7 K/uL (ref 1.7–7.7)
Neutrophils Relative %: 76 %
Platelets: 193 K/uL (ref 150–400)
RBC: 5.56 MIL/uL (ref 4.22–5.81)
RDW: 13.4 % (ref 11.5–15.5)
WBC: 8.7 K/uL (ref 4.0–10.5)
nRBC: 0 % (ref 0.0–0.2)

## 2023-12-15 LAB — LIPASE, BLOOD: Lipase: 28 U/L (ref 11–51)

## 2023-12-15 LAB — GLUCOSE, CAPILLARY
Glucose-Capillary: 222 mg/dL — ABNORMAL HIGH (ref 70–99)
Glucose-Capillary: 218 mg/dL — ABNORMAL HIGH (ref 70–99)
Glucose-Capillary: 353 mg/dL — ABNORMAL HIGH (ref 70–99)

## 2023-12-15 SURGERY — INCISION AND DRAINAGE, ABSCESS, PERIRECTAL
Anesthesia: General | Site: Rectum

## 2023-12-15 MED ORDER — DIPHENHYDRAMINE HCL 50 MG/ML IJ SOLN: 25.0000 mg | Freq: Four times a day (QID) | INTRAMUSCULAR | Status: DC | PRN

## 2023-12-15 MED ORDER — ONDANSETRON 4 MG PO TBDP: 4.0000 mg | ORAL_TABLET | Freq: Four times a day (QID) | ORAL | Status: DC | PRN

## 2023-12-15 MED ORDER — LIDOCAINE HCL (PF) 2 % IJ SOLN
INTRAMUSCULAR | Status: AC
  Filled 2023-12-15: qty 5

## 2023-12-15 MED ORDER — MORPHINE SULFATE (PF) 2 MG/ML IV SOLN: 1.0000 mg | INTRAVENOUS | Status: DC | PRN

## 2023-12-15 MED ORDER — DEXAMETHASONE SODIUM PHOSPHATE 10 MG/ML IJ SOLN
INTRAMUSCULAR | Status: DC | PRN
  Administered 2023-12-15: 4 mg via INTRAVENOUS

## 2023-12-15 MED ORDER — SIMETHICONE 80 MG PO CHEW: 40.0000 mg | CHEWABLE_TABLET | Freq: Four times a day (QID) | ORAL | Status: DC | PRN

## 2023-12-15 MED ORDER — ACETAMINOPHEN 10 MG/ML IV SOLN: 1000.0000 mg | Freq: Once | INTRAVENOUS | Status: DC | PRN

## 2023-12-15 MED ORDER — ENOXAPARIN SODIUM 40 MG/0.4ML IJ SOSY
40.0000 mg | PREFILLED_SYRINGE | INTRAMUSCULAR | Status: DC
  Administered 2023-12-16: 40 mg via SUBCUTANEOUS
  Filled 2023-12-15: qty 0.4

## 2023-12-15 MED ORDER — POLYETHYLENE GLYCOL 3350 17 G PO PACK
17.0000 g | PACK | Freq: Every day | ORAL | Status: DC
  Administered 2023-12-16 – 2023-12-17 (×2): 17 g via ORAL
  Filled 2023-12-15 (×2): qty 1

## 2023-12-15 MED ORDER — TETANUS-DIPHTH-ACELL PERTUSSIS 5-2.5-18.5 LF-MCG/0.5 IM SUSY
0.5000 mL | PREFILLED_SYRINGE | Freq: Once | INTRAMUSCULAR | Status: AC
  Administered 2023-12-15: 0.5 mL via INTRAMUSCULAR
  Filled 2023-12-15: qty 0.5

## 2023-12-15 MED ORDER — PROPOFOL 10 MG/ML IV BOLUS
INTRAVENOUS | Status: AC
  Filled 2023-12-15: qty 20

## 2023-12-15 MED ORDER — ONDANSETRON HCL 4 MG/2ML IJ SOLN: 4.0000 mg | Freq: Four times a day (QID) | INTRAMUSCULAR | Status: DC | PRN

## 2023-12-15 MED ORDER — PIPERACILLIN-TAZOBACTAM 3.375 G IVPB
3.3750 g | Freq: Three times a day (TID) | INTRAVENOUS | Status: DC
  Administered 2023-12-15 – 2023-12-17 (×7): 3.375 g via INTRAVENOUS
  Filled 2023-12-15 (×7): qty 50

## 2023-12-15 MED ORDER — ONDANSETRON HCL 4 MG/2ML IJ SOLN
4.0000 mg | Freq: Once | INTRAMUSCULAR | Status: AC
  Administered 2023-12-15: 4 mg via INTRAVENOUS
  Filled 2023-12-15: qty 2

## 2023-12-15 MED ORDER — BUPIVACAINE-EPINEPHRINE 0.5% -1:200000 IJ SOLN
INTRAMUSCULAR | Status: DC | PRN
  Administered 2023-12-15: 20 mL

## 2023-12-15 MED ORDER — IOHEXOL 300 MG/ML  SOLN
100.0000 mL | Freq: Once | INTRAMUSCULAR | Status: AC | PRN
  Administered 2023-12-15: 100 mL via INTRAVENOUS

## 2023-12-15 MED ORDER — FENTANYL CITRATE (PF) 100 MCG/2ML IJ SOLN
INTRAMUSCULAR | Status: AC
Start: 2023-12-15 — End: 2023-12-15
  Filled 2023-12-15: qty 2

## 2023-12-15 MED ORDER — 0.9 % SODIUM CHLORIDE (POUR BTL) OPTIME
TOPICAL | Status: DC | PRN
Start: 2023-12-15 — End: 2023-12-15
  Administered 2023-12-15: 1000 mL

## 2023-12-15 MED ORDER — OXYCODONE HCL 5 MG PO TABS
5.0000 mg | ORAL_TABLET | ORAL | Status: DC | PRN
  Administered 2023-12-15 – 2023-12-16 (×2): 5 mg via ORAL
  Filled 2023-12-15 (×2): qty 1

## 2023-12-15 MED ORDER — LIDOCAINE-EPINEPHRINE (PF) 2 %-1:200000 IJ SOLN
20.0000 mL | Freq: Once | INTRAMUSCULAR | Status: AC
  Administered 2023-12-15: 20 mL
  Filled 2023-12-15: qty 20

## 2023-12-15 MED ORDER — HYDROMORPHONE HCL 1 MG/ML IJ SOLN: 0.2500 mg | INTRAMUSCULAR | Status: DC | PRN

## 2023-12-15 MED ORDER — MORPHINE SULFATE (PF) 4 MG/ML IV SOLN
4.0000 mg | Freq: Once | INTRAVENOUS | Status: AC
  Administered 2023-12-15: 4 mg via INTRAVENOUS
  Filled 2023-12-15: qty 1

## 2023-12-15 MED ORDER — OXYCODONE HCL 5 MG/5ML PO SOLN: 5.0000 mg | Freq: Once | ORAL | Status: DC | PRN

## 2023-12-15 MED ORDER — ACETAMINOPHEN 500 MG PO TABS
1000.0000 mg | ORAL_TABLET | Freq: Four times a day (QID) | ORAL | Status: DC
  Administered 2023-12-15 – 2023-12-17 (×7): 1000 mg via ORAL
  Filled 2023-12-15 (×7): qty 2

## 2023-12-15 MED ORDER — INSULIN ASPART 100 UNIT/ML IJ SOLN
0.0000 [IU] | Freq: Three times a day (TID) | INTRAMUSCULAR | Status: DC
  Administered 2023-12-16 – 2023-12-17 (×5): 5 [IU] via SUBCUTANEOUS
  Filled 2023-12-15: qty 1

## 2023-12-15 MED ORDER — SUCCINYLCHOLINE CHLORIDE 200 MG/10ML IV SOSY
PREFILLED_SYRINGE | INTRAVENOUS | Status: DC | PRN
Start: 2023-12-15 — End: 2023-12-15
  Administered 2023-12-15: 140 mg via INTRAVENOUS

## 2023-12-15 MED ORDER — INSULIN ASPART 100 UNIT/ML IJ SOLN
0.0000 [IU] | INTRAMUSCULAR | Status: DC | PRN
  Administered 2023-12-15: 3 [IU] via SUBCUTANEOUS

## 2023-12-15 MED ORDER — OXYCODONE HCL 5 MG PO TABS: 5.0000 mg | ORAL_TABLET | Freq: Once | ORAL | Status: DC | PRN

## 2023-12-15 MED ORDER — BUPIVACAINE-EPINEPHRINE (PF) 0.5% -1:200000 IJ SOLN
INTRAMUSCULAR | Status: AC
  Filled 2023-12-15: qty 30

## 2023-12-15 MED ORDER — DOCUSATE SODIUM 100 MG PO CAPS
100.0000 mg | ORAL_CAPSULE | Freq: Two times a day (BID) | ORAL | Status: DC
  Administered 2023-12-16 – 2023-12-17 (×3): 100 mg via ORAL
  Filled 2023-12-15 (×3): qty 1

## 2023-12-15 MED ORDER — PROPOFOL 10 MG/ML IV BOLUS
INTRAVENOUS | Status: DC | PRN
  Administered 2023-12-15: 200 mg via INTRAVENOUS

## 2023-12-15 MED ORDER — FENTANYL CITRATE (PF) 100 MCG/2ML IJ SOLN
INTRAMUSCULAR | Status: AC
  Filled 2023-12-15: qty 2

## 2023-12-15 MED ORDER — MELATONIN 3 MG PO TABS: 3.0000 mg | ORAL_TABLET | Freq: Every evening | ORAL | Status: DC | PRN

## 2023-12-15 MED ORDER — LACTATED RINGERS IV SOLN: INTRAVENOUS | Status: DC

## 2023-12-15 MED ORDER — CHLORHEXIDINE GLUCONATE 0.12 % MT SOLN
15.0000 mL | Freq: Once | OROMUCOSAL | Status: AC
  Administered 2023-12-15: 15 mL via OROMUCOSAL

## 2023-12-15 MED ORDER — ONDANSETRON HCL 4 MG/2ML IJ SOLN
INTRAMUSCULAR | Status: DC | PRN
  Administered 2023-12-15: 4 mg via INTRAVENOUS

## 2023-12-15 MED ORDER — POTASSIUM CHLORIDE IN NACL 20-0.9 MEQ/L-% IV SOLN
INTRAVENOUS | Status: AC
  Filled 2023-12-15 (×2): qty 1000

## 2023-12-15 MED ORDER — ONDANSETRON HCL 4 MG/2ML IJ SOLN
INTRAMUSCULAR | Status: AC
  Filled 2023-12-15: qty 2

## 2023-12-15 MED ORDER — LIDOCAINE HCL (CARDIAC) PF 100 MG/5ML IV SOSY
PREFILLED_SYRINGE | INTRAVENOUS | Status: DC | PRN
Start: 2023-12-15 — End: 2023-12-15
  Administered 2023-12-15: 50 mg via INTRAVENOUS

## 2023-12-15 MED ORDER — DROPERIDOL 2.5 MG/ML IJ SOLN: 0.6250 mg | Freq: Once | INTRAMUSCULAR | Status: DC | PRN

## 2023-12-15 MED ORDER — FENTANYL CITRATE (PF) 100 MCG/2ML IJ SOLN
INTRAMUSCULAR | Status: DC | PRN
  Administered 2023-12-15 (×2): 50 ug via INTRAVENOUS
  Administered 2023-12-15: 100 ug via INTRAVENOUS

## 2023-12-15 MED ORDER — DIPHENHYDRAMINE HCL 25 MG PO CAPS
25.0000 mg | ORAL_CAPSULE | Freq: Four times a day (QID) | ORAL | Status: DC | PRN
Start: 2023-12-15 — End: 2023-12-17

## 2023-12-15 MED ORDER — DEXAMETHASONE SODIUM PHOSPHATE 10 MG/ML IJ SOLN
INTRAMUSCULAR | Status: AC
  Filled 2023-12-15: qty 1

## 2023-12-15 MED ORDER — ORAL CARE MOUTH RINSE: 15.0000 mL | Freq: Once | OROMUCOSAL | Status: AC

## 2023-12-15 SURGICAL SUPPLY — 17 items
BAG COUNTER SPONGE SURGICOUNT (BAG) IMPLANT
BLADE SURG 15 STRL LF DISP TIS (BLADE) ×1 IMPLANT
DRAIN PENROSE 0.25X18 (DRAIN) IMPLANT
ELECT PENCIL ROCKER SW 15FT (MISCELLANEOUS) IMPLANT
GAUZE 4X4 16PLY ~~LOC~~+RFID DBL (SPONGE) ×1 IMPLANT
GAUZE PAD ABD 8X10 STRL (GAUZE/BANDAGES/DRESSINGS) IMPLANT
GAUZE SPONGE 4X4 12PLY STRL (GAUZE/BANDAGES/DRESSINGS) IMPLANT
GLOVE BIO SURGEON STRL SZ 6.5 (GLOVE) ×1 IMPLANT
GLOVE INDICATOR 6.5 STRL GRN (GLOVE) ×2 IMPLANT
GOWN STRL REUS W/ TWL XL LVL3 (GOWN DISPOSABLE) ×2 IMPLANT
PACK LITHOTOMY IV (CUSTOM PROCEDURE TRAY) ×1 IMPLANT
PACKING GAUZE IODOFORM 1INX5YD (GAUZE/BANDAGES/DRESSINGS) IMPLANT
PENCIL SMOKE EVACUATOR (MISCELLANEOUS) IMPLANT
SURGILUBE 2OZ TUBE FLIPTOP (MISCELLANEOUS) ×1 IMPLANT
SUT SILK 2-0 18XBRD TIE 12 (SUTURE) IMPLANT
TOWEL OR 17X26 10 PK STRL BLUE (TOWEL DISPOSABLE) ×1 IMPLANT
YANKAUER SUCT BULB TIP 10FT TU (MISCELLANEOUS) IMPLANT

## 2023-12-15 NOTE — ED Provider Notes (Signed)
 [place] EMERGENCY DEPARTMENT AT [facility] Provider Note   CSN: [id number] Arrival date & time: 12/15/23  1016     Patient presents with: Abscess and Constipation  HPI Dennis Black is a 43 y.o. male presenting for perianal abscess.  He states he noticed a bump around his anus about 3 days ago.  He states it is very painful.  Bowel movements make it worse.  Also endorses subjective fever and chills at home.  Denies nausea vomiting diarrhea.  Denies urinary symptoms.    Abscess Constipation      Prior to Admission medications   Not on File    Allergies: Patient has no allergy information on record.    Review of Systems  Gastrointestinal:  Positive for constipation.    Updated Vital Signs BP 123/72   Pulse 75   Temp 99.5 F (37.5 C) (Oral)   Resp 18   SpO2 96%   Physical Exam Vitals and nursing note reviewed. Exam conducted with a chaperone present.  HENT:     Head: Normocephalic and atraumatic.     Mouth/Throat:     Mouth: Mucous membranes are moist.  Eyes:     General:        Right eye: No discharge.        Left eye: No discharge.     Conjunctiva/sclera: Conjunctivae normal.  Cardiovascular:     Rate and Rhythm: Normal rate and regular rhythm.     Pulses: Normal pulses.     Heart sounds: Normal heart sounds.  Pulmonary:     Effort: Pulmonary effort is normal.     Breath sounds: Normal breath sounds.  Abdominal:     General: Abdomen is flat. There is no distension.     Palpations: Abdomen is soft.     Tenderness: There is no abdominal tenderness.  Genitourinary:  Skin:    General: Skin is warm and dry.  Neurological:     General: No focal deficit present.  Psychiatric:        Mood and Affect: Mood normal.     (all labs ordered are listed, but only abnormal results are displayed) Labs Reviewed  COMPREHENSIVE METABOLIC PANEL WITH GFR - Abnormal; Notable for the following components:      Result Value   Sodium 133 (*)     Glucose, Bld 282 (*)    All other components within normal limits  CBC WITH DIFFERENTIAL/PLATELET  LIPASE, BLOOD  HEMOGLOBIN A1C    EKG: None  Radiology: CT ABDOMEN PELVIS W CONTRAST Result Date: 12/15/2023 CLINICAL DATA:  Perianal abscess.  Difficulty having bowel movement. EXAM: CT ABDOMEN AND PELVIS WITH CONTRAST TECHNIQUE: Multidetector CT imaging of the abdomen and pelvis was performed using the standard protocol following bolus administration of intravenous contrast. RADIATION DOSE REDUCTION: This exam was performed according to the departmental dose-optimization program which includes automated exposure control, adjustment of the mA and/or kV according to patient size and/or use of iterative reconstruction technique. CONTRAST:  OMNIPAQUE  IOHEXOL  300 MG/ML  SOLN COMPARISON:  None available FINDINGS: Lower chest: Linear dependent atelectasis or scarring in the left base. No effusions. Hepatobiliary: No focal hepatic abnormality. Gallbladder unremarkable. Pancreas: No focal abnormality or ductal dilatation. Spleen: No focal abnormality.  Normal size. Adrenals/Urinary Tract: No adrenal abnormality. No focal renal abnormality. No stones or hydronephrosis. Urinary bladder is unremarkable. Stomach/Bowel: Normal appendix. Stomach, large and small bowel grossly unremarkable. Vascular/Lymphatic: No evidence of aneurysm or adenopathy. Reproductive: No visible focal abnormality. Other: No free  fluid or free air. Fluid collections noted posterior and to the left of the anus/rectum compatible with perianal abscess. This measures approximately 4 x 2.5 cm. Musculoskeletal: No acute bony abnormality. IMPRESSION: 4 x 2.5 cm fluid collection noted posterior and to the left of the anus/rectum compatible with perianal abscess. Electronically Signed   By: Franky Crease M.D.   On: 12/15/2023 14:33     .Incision and Drainage  Date/Time: 12/15/2023 4:10 PM  Performed by: Lang Norleen POUR, PA-C Authorized by:  Lang Norleen POUR, PA-C   Consent:    Consent obtained:  Verbal   Consent given by:  Patient   Risks discussed:  Bleeding, incomplete drainage, pain and damage to other organs   Alternatives discussed:  No treatment Universal protocol:    Procedure explained and questions answered to patient or proxy's satisfaction: yes     Relevant documents present and verified: yes     Test results available : yes     Imaging studies available: yes     Required blood products, implants, devices, and special equipment available: yes     Site/side marked: yes     Immediately prior to procedure, a time out was called: yes     Patient identity confirmed:  Verbally with patient Location:    Type:  Abscess   Location:  Anogenital   Anogenital location:  Perianal Pre-procedure details:    Skin preparation:  Betadine Anesthesia:    Anesthesia method:  Local infiltration   Local anesthetic:  Lidocaine  1% WITH epi Procedure type:    Complexity:  Complex Procedure details:    Incision types:  Stab incision (stab incision x2 to left perianal area)   Incision depth:  Subcutaneous   Wound management:  Probed and deloculated, irrigated with saline and extensive cleaning   Drainage:  Purulent   Drainage amount:  Scant   Wound treatment:  Wound left open   Packing materials:  None Post-procedure details:    Procedure completion:  Tolerated well, no immediate complications Comments:     Attempted to drain the abscess x 2 but only relieved a small amount of bloody discharged.     Medications Ordered in the ED  piperacillin -tazobactam (ZOSYN ) IVPB 3.375 g (3.375 g Intravenous New Bag/Given 12/15/23 1614)  enoxaparin  (LOVENOX ) injection 40 mg (has no administration in time range)  0.9 % NaCl with KCl 20 mEq/ L  infusion (has no administration in time range)  acetaminophen  (TYLENOL ) tablet 1,000 mg (has no administration in time range)  oxyCODONE  (Oxy IR/ROXICODONE ) immediate release tablet 5-10 mg (has no  administration in time range)  morphine  (PF) 2 MG/ML injection 1-4 mg (has no administration in time range)  melatonin tablet 3 mg (has no administration in time range)  diphenhydrAMINE  (BENADRYL ) capsule 25 mg (has no administration in time range)    Or  diphenhydrAMINE  (BENADRYL ) injection 25 mg (has no administration in time range)  polyethylene glycol (MIRALAX  / GLYCOLAX ) packet 17 g (has no administration in time range)  docusate sodium  (COLACE) capsule 100 mg (has no administration in time range)  ondansetron  (ZOFRAN -ODT) disintegrating tablet 4 mg (has no administration in time range)    Or  ondansetron  (ZOFRAN ) injection 4 mg (has no administration in time range)  simethicone  (MYLICON) chewable tablet 40 mg (has no administration in time range)  insulin  aspart (novoLOG ) injection 0-15 Units (has no administration in time range)  morphine  (PF) 4 MG/ML injection 4 mg (4 mg Intravenous Given 12/15/23 1238)  ondansetron  (ZOFRAN ) injection  4 mg (4 mg Intravenous Given 12/15/23 1239)  iohexol  (OMNIPAQUE ) 300 MG/ML solution 100 mL (100 mLs Intravenous Contrast Given 12/15/23 1338)  lidocaine -EPINEPHrine  (XYLOCAINE  W/EPI) 2 %-1:200000 (PF) injection 20 mL (20 mLs Infiltration Given by Other 12/15/23 1513)  Tdap (BOOSTRIX) injection 0.5 mL (0.5 mLs Intramuscular Given 12/15/23 1513)    Clinical Course as of 12/15/23 1618  Thu Dec 15, 2023  1438 CT ABDOMEN PELVIS W CONTRAST [JR]    Clinical Course User Index [JR] Lang Norleen POUR, PA-C                                 Medical Decision Making Amount and/or Complexity of Data Reviewed Labs: ordered. Radiology: ordered. Decision-making details documented in ED Course.  Risk Prescription drug management. Decision regarding hospitalization.    Initial Impression and Ddx 43 year old well-appearing male presenting for concern for perianal abscess.  Exam notable for what appeared to be a large perianal abscess.  DDx includes perianal  abscess, deep space abscess into the abdomen, fistula, sepsis, Fournier's, other. Patient PMH that increases complexity of ED encounter:  none  Interpretation of Diagnostics - I independent reviewed and interpreted the labs as followed: hyperglycemia  - I independently visualized the following imaging with scope of interpretation limited to determining acute life threatening conditions related to emergency care: CT ab/pelvis, which revealed perianal abscess approximately 4 x 2-1/2 cm  Patient Reassessment and Ultimate Disposition/Management Attempted I&D x 2 but unsuccessful.  Reached out to general surgery and spoke to [employer], [place].  Surgery was able to evaluate him at the bedside and ultimately decided to take him to the OR.  At this time he is hemodynamically stable, well-appearing and in no acute distress.  Awaiting OR transfer.  Patient management required discussion with the following services or consulting groups:  General/Trauma Surgery  Complexity of Problems Addressed Acute complicated illness or Injury  Additional Data Reviewed and Analyzed Further history obtained from: Past medical history and medications listed in the EMR and Prior ED visit notes  Patient Encounter Risk Assessment Consideration of hospitalization      Final diagnoses:  Perianal abscess    ED Discharge Orders     None          Leilan Bochenek K, PA-C 12/15/23 1618    Patsey Lot, MD 12/15/23 1646

## 2023-12-15 NOTE — ED Provider Triage Note (Signed)
 Emergency Medicine Provider Triage Evaluation Note  Kingdom Vanzanten , a 43 y.o. male  was evaluated in triage.  Pt complains of rectal pain.  Family reports pt is constipated and has large bumps on his bottom   Review of Systems  Positive: constipation Negative:   Physical Exam  BP (!) 117/95 (BP Location: Right Arm)   Pulse 94   Temp 99.7 F (37.6 C) (Oral)   Resp 18   SpO2 99%  Gen:   Awake, no distress   Resp:  Normal effort  MSK:   Moves extremities without difficulty  Other:    Medical Decision Making  Medically screening exam initiated at 10:46 AM.  Appropriate orders placed.  Luciano Pinard was informed that the remainder of the evaluation will be completed by another provider, this initial triage assessment does not replace that evaluation, and the importance of remaining in the ED until their evaluation is complete.     Flint Sonny POUR, PA-C 12/15/23 1047

## 2023-12-15 NOTE — Transfer of Care (Signed)
 Immediate Anesthesia Transfer of Care Note  Patient: Dennis Black  Procedure(s) Performed: INCISION AND DRAINAGE, ABSCESS, PERIRECTAL (Rectum)  Patient Location: PACU  Anesthesia Type:General  Level of Consciousness: awake, alert , and oriented  Airway & Oxygen Therapy: Patient Spontanous Breathing  Post-op Assessment: Report given to RN and Post -op Vital signs reviewed and stable  Post vital signs: Reviewed and stable  Last Vitals:  Vitals Value Taken Time  BP 145/90 12/15/23 18:25  Temp    Pulse 76 12/15/23 18:27  Resp 19 12/15/23 18:27  SpO2 100 % 12/15/23 18:27  Vitals shown include unfiled device data.  Last Pain:  Vitals:   12/15/23 1712  TempSrc:   PainSc: 5          Complications: No notable events documented.

## 2023-12-15 NOTE — H&P (Signed)
 Dennis Black 22-Jun-1980  [id number].    Requesting MD: [place]  Chief Complaint/Reason for Consult: Perianal/Perirectal abscess   HPI: Dennis Black is a 43 y.o. male with no significant PMHx who presents to [facility] ED with complain of rectal pain that started 3 days ago accompanied by subjective fever. He denies, nausea or vomiting. Patient stated that since onset, it hurts to defecate and to do any bending which has affected his ability to perform well at work. Patient stated that this has happened before about a month ago but spontaneously drained. He experienced extreme pain and some creamy/white drainage last night which prompted him to visit an urgent care for evaluation. He was then advised to visit the ED for further work up and management of his symptoms. He denies taking any prescribed medications including blood thinners, denies smoking or chewing tobacco, denies using alcohol or any illicit substances. His last meal was yesterday evening.   ROS: All systems negative except for HPI   History reviewed. No pertinent family history.  History reviewed. No pertinent past medical history.  History reviewed. No pertinent surgical history.  Social History:  has no history on file for tobacco use, alcohol use, and drug use.  Allergies: No Known Allergies  (Not in a hospital admission)    Physical Exam: Blood pressure 123/72, pulse 75, temperature 99.5 F (37.5 C), temperature source Oral, resp. rate 18, SpO2 96%.  Constitutional:      General: He is not in acute distress. Cardiovascular:     Rate and Rhythm: Normal rate.  Pulmonary:     Effort: Pulmonary effort is normal.  Genitourinary:    Comments: There is exquisite tenderness to palpation noted to the left and posteriorly to the anus. No erythema or fluctuance noted on exam. Skin:    General: Skin is warm and dry.  Neurological:     Mental Status: He is alert and oriented to person, place, and time.     Results for orders placed or performed during the hospital encounter of 12/15/23 (from the past 48 hours)  CBC with Differential     Status: None   Collection Time: 12/15/23 12:46 PM  Result Value Ref Range   WBC 8.7 4.0 - 10.5 K/uL   RBC 5.56 4.22 - 5.81 MIL/uL   Hemoglobin 15.5 13.0 - 17.0 g/dL   HCT 53.0 60.9 - 47.9 %   MCV 84.4 80.0 - 100.0 fL   MCH 27.9 26.0 - 34.0 pg   MCHC 33.0 30.0 - 36.0 g/dL   RDW 86.5 88.4 - 84.4 %   Platelets 193 150 - 400 K/uL   nRBC 0.0 0.0 - 0.2 %   Neutrophils Relative % 76 %   Neutro Abs 6.7 1.7 - 7.7 K/uL   Lymphocytes Relative 16 %   Lymphs Abs 1.4 0.7 - 4.0 K/uL   Monocytes Relative 6 %   Monocytes Absolute 0.5 0.1 - 1.0 K/uL   Eosinophils Relative 1 %   Eosinophils Absolute 0.0 0.0 - 0.5 K/uL   Basophils Relative 0 %   Basophils Absolute 0.0 0.0 - 0.1 K/uL   Immature Granulocytes 1 %   Abs Immature Granulocytes 0.06 0.00 - 0.07 K/uL    Comment: Performed at [facility], [address] [place], [place] [address]  Comprehensive metabolic panel     Status: Abnormal   Collection Time: 12/15/23 12:46 PM  Result Value Ref Range   Sodium 133 (L) 135 - 145 mmol/L  Potassium 3.8 3.5 - 5.1 mmol/L   Chloride 98 98 - 111 mmol/L   CO2 25 22 - 32 mmol/L   Glucose, Bld 282 (H) 70 - 99 mg/dL    Comment: Glucose reference range applies only to samples taken after fasting for at least 8 hours.   BUN 15 6 - 20 mg/dL   Creatinine, Ser 9.28 0.61 - 1.24 mg/dL   Calcium 9.3 8.9 - 89.6 mg/dL   Total Protein 7.8 6.5 - 8.1 g/dL   Albumin 4.4 3.5 - 5.0 g/dL   AST 22 15 - 41 U/L   ALT 37 0 - 44 U/L   Alkaline Phosphatase 101 38 - 126 U/L   Total Bilirubin 1.0 0.0 - 1.2 mg/dL   GFR, Estimated >39 >39 mL/min    Comment: (NOTE) Calculated using the CKD-EPI Creatinine Equation (2021)    Anion gap 10 5 - 15    Comment: Performed at [facility], [address], [place], [place] [address]  Lipase, blood     Status:  None   Collection Time: 12/15/23 12:46 PM  Result Value Ref Range   Lipase 28 11 - 51 U/L    Comment: Performed at [facility], [address] [place], [place] [address]   CT ABDOMEN PELVIS W CONTRAST Result Date: 12/15/2023 CLINICAL DATA:  Perianal abscess.  Difficulty having bowel movement. EXAM: CT ABDOMEN AND PELVIS WITH CONTRAST TECHNIQUE: Multidetector CT imaging of the abdomen and pelvis was performed using the standard protocol following bolus administration of intravenous contrast. RADIATION DOSE REDUCTION: This exam was performed according to the departmental dose-optimization program which includes automated exposure control, adjustment of the mA and/or kV according to patient size and/or use of iterative reconstruction technique. CONTRAST:  OMNIPAQUE  IOHEXOL  300 MG/ML  SOLN COMPARISON:  None available FINDINGS: Lower chest: Linear dependent atelectasis or scarring in the left base. No effusions. Hepatobiliary: No focal hepatic abnormality. Gallbladder unremarkable. Pancreas: No focal abnormality or ductal dilatation. Spleen: No focal abnormality.  Normal size. Adrenals/Urinary Tract: No adrenal abnormality. No focal renal abnormality. No stones or hydronephrosis. Urinary bladder is unremarkable. Stomach/Bowel: Normal appendix. Stomach, large and small bowel grossly unremarkable. Vascular/Lymphatic: No evidence of aneurysm or adenopathy. Reproductive: No visible focal abnormality. Other: No free fluid or free air. Fluid collections noted posterior and to the left of the anus/rectum compatible with perianal abscess. This measures approximately 4 x 2.5 cm. Musculoskeletal: No acute bony abnormality. IMPRESSION: 4 x 2.5 cm fluid collection noted posterior and to the left of the anus/rectum compatible with perianal abscess. Electronically Signed   By: Franky Crease M.D.   On: 12/15/2023 14:33    Anti-infectives (From admission, onward)    Start     Dose/Rate Route  Frequency Ordered Stop   12/15/23 1530  piperacillin -tazobactam (ZOSYN ) IVPB 3.375 g        3.375 g 12.5 mL/hr over 240 Minutes Intravenous Every 8 hours 12/15/23 1523         Assessment/Plan Perirectal/Perianal abscess Dennis Black is a 43 y.o. male with no significant PMH who presents to [facility] ED with complain of rectal pain.  -Patient with symptoms consistent with perianal/perirectal abscess. CT scan shows perianal abscess with 4 X 2.5 cm abscess in posterior and left of rectum. WBC w/in normal limits at 8.7 K.  -Based on imaging, labs and PE, patient will most likely need to be examined under anesthesia and I&D performed in the OR. Will consult with my attending for  timing of operation.   -Hyperglycemia- will check hgb A1C as patient may have undiagnosed/uncontrolled DM. If A1C is elevated, plan is to consult [facility] for assistance with DM management.    FEN - NPO  VTE - SCDs ID - Zosyn   Dispo - observation, med-surg  I reviewed nursing notes, ED provider notes, last 24 h vitals and pain scores, last 48 h intake and output, last 24 h labs and trends, and last 24 h imaging results.   Eulah Hammonds, [facility] Surgery 12/15/2023, 4:38 PM Please see Amion for pager number during day hours 7:00am-4:30pm

## 2023-12-15 NOTE — Op Note (Signed)
 12/15/2023  6:11 PM  PATIENT:  Gunter Conde  43 y.o. male  Patient Care Team: Pcp, No as PCP - General  PRE-OPERATIVE DIAGNOSIS:  perirectal abscess  POST-OPERATIVE DIAGNOSIS:  perirectal abscess  PROCEDURE:  INCISION AND DRAINAGE, ABSCESS, PERIRECTAL    Surgeon(s): Debby Hila, MD  ASSISTANT: none   ANESTHESIA:   local and MAC  SPECIMEN:  No Specimen  DISPOSITION OF SPECIMEN:  N/A  COUNTS:  YES  PLAN OF CARE: Admit for overnight observation  PATIENT DISPOSITION:  PACU - hemodynamically stable.  INDICATION: 43 y.o. M with perirectal abscess, unable to drain in ED   OR FINDINGS: L posterior ischiorectal abscess  DESCRIPTION: the patient was identified in the preoperative holding area and taken to the OR where they were laid on the operating room table.  MAC anesthesia was induced without difficulty. The patient was then positioned in prone jackknife position with buttocks gently taped apart.  The patient was then prepped and draped in usual sterile fashion.  SCDs were noted to be in place prior to the initiation of anesthesia. A surgical timeout was performed indicating the correct patient, procedure, positioning and need for preoperative antibiotics.  A rectal block was performed using Marcaine  with epinephrine .    I began with a digital rectal exam.  There was no bulging or fluctuance noted.  I aspirated in the left posterior space and identified a pocket of purulence in the left posterior ischiorectal space.  I cut down on that area just lateral to the sphincter complex.  The abscess cavity was then entered bluntly using a hemostat. The cavity tracked to posterior midline and stopped. The skin and subcutaneous tissue over the abscess was removed for adequate drainage.  The cavity was irrigated with normal saline and then hemostasis was achieved using cautery.  The cavity was packed with iodoform gauze for added hemostasis.  A dressing was applied.  The patient was awakened  from anesthesia and sent to the PACU in stable condition.  All counts were correct per OR staff.    Hila JAYSON Debby, MD  Colorectal and General Surgery [facility] Surgery

## 2023-12-15 NOTE — Anesthesia Preprocedure Evaluation (Addendum)
 Anesthesia Evaluation  Patient identified by MRN, date of birth, ID band Patient awake    Reviewed: Allergy & Precautions, NPO status , Patient's Chart, lab work & pertinent test results  Airway Mallampati: I  TM Distance: >3 FB Neck ROM: Full    Dental  (+) Missing, Dental Advisory Given   Pulmonary neg pulmonary ROS   Pulmonary exam normal        Cardiovascular negative cardio ROS  Rhythm:Regular Rate:Normal     Neuro/Psych negative neurological ROS  negative psych ROS   GI/Hepatic negative GI ROS, Neg liver ROS,,,  Endo/Other  negative endocrine ROS    Renal/GU negative Renal ROS     Musculoskeletal negative musculoskeletal ROS (+)    Abdominal   Peds  Hematology negative hematology ROS (+)   Anesthesia Other Findings   Reproductive/Obstetrics                              Anesthesia Physical Anesthesia Plan  ASA: 2  Anesthesia Plan: General   Post-op Pain Management: Toradol IV (intra-op)* and Ofirmev  IV (intra-op)*   Induction: Intravenous  PONV Risk Score and Plan: 3 and Ondansetron , Dexamethasone  and Midazolam  Airway Management Planned: Oral ETT  Additional Equipment: None  Intra-op Plan:   Post-operative Plan: Extubation in OR  Informed Consent: I have reviewed the patients History and Physical, chart, labs and discussed the procedure including the risks, benefits and alternatives for the proposed anesthesia with the patient or authorized representative who has indicated his/her understanding and acceptance.     Dental advisory given  Plan Discussed with: CRNA  Anesthesia Plan Comments:          Anesthesia Quick Evaluation

## 2023-12-15 NOTE — Anesthesia Procedure Notes (Signed)
 Procedure Name: Intubation Date/Time: 12/15/2023 6:06 PM  Performed by: Gladis Honey, CRNAPre-anesthesia Checklist: Patient identified, Emergency Drugs available, Suction available and Patient being monitored Patient Re-evaluated:Patient Re-evaluated prior to induction Oxygen Delivery Method: Circle System Utilized Preoxygenation: Pre-oxygenation with 100% oxygen Induction Type: IV induction Ventilation: Mask ventilation without difficulty Laryngoscope Size: Miller and 2 Grade View: Grade I Tube type: Oral Number of attempts: 1 Airway Equipment and Method: Stylet and Oral airway Placement Confirmation: ETT inserted through vocal cords under direct vision, positive ETCO2 and breath sounds checked- equal and bilateral Secured at: 22 cm Tube secured with: Tape Dental Injury: Teeth and Oropharynx as per pre-operative assessment

## 2023-12-15 NOTE — Anesthesia Postprocedure Evaluation (Signed)
 Anesthesia Post Note  Patient: Dennis Black  Procedure(s) Performed: INCISION AND DRAINAGE, ABSCESS, PERIRECTAL (Rectum)     Patient location during evaluation: PACU Anesthesia Type: General Level of consciousness: awake and alert Pain management: pain level controlled Vital Signs Assessment: post-procedure vital signs reviewed and stable Respiratory status: spontaneous breathing, nonlabored ventilation, respiratory function stable and patient connected to nasal cannula oxygen Cardiovascular status: blood pressure returned to baseline and stable Postop Assessment: no apparent nausea or vomiting Anesthetic complications: no   No notable events documented.  Last Vitals:  Vitals:   12/15/23 1930 12/15/23 2148  BP: 121/76 122/62  Pulse: 65 70  Resp: 17 16  Temp: 36.7 C 37.4 C  SpO2: 96% 95%    Last Pain:  Vitals:   12/15/23 1930  TempSrc: Oral  PainSc:                  Armando Bukhari D Quaniyah Bugh

## 2023-12-15 NOTE — ED Triage Notes (Signed)
 Patient in today reporting perirectal abscess that extends into the perianal sphincter that started 3 days ago. Difficulty having bowel movement.

## 2023-12-16 ENCOUNTER — Other Ambulatory Visit: Payer: Self-pay

## 2023-12-16 ENCOUNTER — Encounter (HOSPITAL_COMMUNITY): Payer: Self-pay | Admitting: General Surgery

## 2023-12-16 ENCOUNTER — Other Ambulatory Visit (HOSPITAL_COMMUNITY): Payer: Self-pay

## 2023-12-16 ENCOUNTER — Other Ambulatory Visit (HOSPITAL_BASED_OUTPATIENT_CLINIC_OR_DEPARTMENT_OTHER): Payer: Self-pay

## 2023-12-16 DIAGNOSIS — K611 Rectal abscess: Secondary | ICD-10-CM

## 2023-12-16 LAB — BASIC METABOLIC PANEL WITH GFR
Anion gap: 9 (ref 5–15)
BUN: 12 mg/dL (ref 6–20)
CO2: 22 mmol/L (ref 22–32)
Calcium: 9 mg/dL (ref 8.9–10.3)
Chloride: 102 mmol/L (ref 98–111)
Creatinine, Ser: 0.59 mg/dL — ABNORMAL LOW (ref 0.61–1.24)
GFR, Estimated: >60 mL/min (ref >60)
Glucose, Bld: 290 mg/dL — ABNORMAL HIGH (ref 70–99)
Potassium: 4.3 mmol/L (ref 3.5–5.1)
Sodium: 133 mmol/L — ABNORMAL LOW (ref 135–145)

## 2023-12-16 LAB — HIV ANTIBODY (ROUTINE TESTING W REFLEX): HIV Screen 4th Generation wRfx: NONREACTIVE

## 2023-12-16 LAB — HEMOGLOBIN A1C
Hgb A1c MFr Bld: 10.9 % — ABNORMAL HIGH (ref 4.8–5.6)
Mean Plasma Glucose: 266.13 mg/dL

## 2023-12-16 LAB — GLUCOSE, CAPILLARY
Glucose-Capillary: 233 mg/dL — ABNORMAL HIGH (ref 70–99)
Glucose-Capillary: 232 mg/dL — ABNORMAL HIGH (ref 70–99)
Glucose-Capillary: 239 mg/dL — ABNORMAL HIGH (ref 70–99)
Glucose-Capillary: 240 mg/dL — ABNORMAL HIGH (ref 70–99)

## 2023-12-16 MED ORDER — AMOXICILLIN-POT CLAVULANATE 875-125 MG PO TABS: 1.0000 | ORAL_TABLET | Freq: Two times a day (BID) | ORAL | 0 refills | Status: AC

## 2023-12-16 MED ORDER — LIVING WELL WITH DIABETES BOOK - IN SPANISH
Freq: Once | Status: AC
  Filled 2023-12-16: qty 1

## 2023-12-16 MED ORDER — ACETAMINOPHEN 500 MG PO TABS: 500.0000 mg | ORAL_TABLET | Freq: Four times a day (QID) | ORAL | Status: AC | PRN

## 2023-12-16 MED ORDER — INSULIN STARTER KIT- SYRINGES (SPANISH)
1.0000 | Freq: Once | Status: AC
  Administered 2023-12-16: 1
  Filled 2023-12-16: qty 1

## 2023-12-16 MED ORDER — INSULIN GLARGINE-YFGN 100 UNIT/ML ~~LOC~~ SOLN
10.0000 [IU] | Freq: Every day | SUBCUTANEOUS | Status: DC
  Administered 2023-12-16 – 2023-12-17 (×2): 10 [IU] via SUBCUTANEOUS
  Filled 2023-12-16 (×2): qty 0.1

## 2023-12-16 NOTE — Consult Note (Signed)
 [facility] Initial Consultation Note  Dennis Black FMW:[id number] DOB: [birth year] DOA: 12/15/2023  PCP: Freddrick, No   Requesting Physician: Dr. Debby   Reason for Consultation: Medical Management of Newly Diagnosed DM  HPI: Dennis Black is a 43 y.o. male with no significant past medical history who was admitted by the surgical service for perirectal abscess and underwent incision and drainage on 7/17.  Lab work in the hospital shows evidence of hyperglycemia, with blood sugar as high as 353.  Hemoglobin A1c is 10.9.  Patient states that for the last 3 or 4 weeks, he has had some blurry vision, polydipsia and polyuria.  Denies any rashes on the skin.  Currently he is comfortable and asymptomatic, he has been on sliding scale insulin  hospitalist consultation was requested for new onset diabetes and associated management.  Patient was seen and examined using electronic Spanish interpreter [place].  Review of Systems: Please see HPI for pertinent positives and negatives. A complete 10 system review of systems are otherwise negative.  History reviewed. No pertinent past medical history. Past Surgical History:  Procedure Laterality Date   INCISION AND DRAINAGE PERIRECTAL ABSCESS N/A 12/15/2023   Procedure: INCISION AND DRAINAGE, ABSCESS, PERIRECTAL;  Surgeon: Debby Hila, MD;  Location: [facility];  Service: General;  Laterality: N/A;    Social History:  has no history on file for tobacco use, alcohol use, and drug use.  No Known Allergies  History reviewed. No pertinent family history.   Prior to Admission medications   Medication Sig Start Date End Date Taking? Authorizing Provider  ADVIL 200 MG tablet Take 200-400 mg by mouth every 6 (six) hours as needed (for pain or headaches).   Yes [provider]  TYLENOL  500 MG tablet Take 500-1,000 mg by mouth every 6 (six) hours as needed (for pain or headaches).   Yes [provider]    Physical Exam: BP 114/61 (BP  Location: Right Arm)   Pulse (!) 59   Temp 98.3 F (36.8 C)   Resp 16   Ht 5' 5 (1.651 m)   Wt 94.1 kg   SpO2 97%   BMI 34.52 kg/m   General:  Alert, oriented, calm, in no acute distress  Eyes: EOMI, clear conjuctivae, white sclerea Neck: supple, no masses, trachea mildline  Cardiovascular: RRR, no murmurs or rubs, no peripheral edema  Respiratory: clear to auscultation bilaterally, no wheezes, no crackles  Abdomen: soft, nontender, nondistended, normal bowel tones heard  Skin: dry, no rashes  Musculoskeletal: no joint effusions, normal range of motion  Psychiatric: appropriate affect, normal speech  Neurologic: extraocular muscles intact, clear speech, moving all extremities with intact sensorium         Recent Labs and Imaging Reviewed:  Basic Metabolic Panel: Recent Labs  Lab 12/15/23 1246 12/16/23 0329  NA 133* 133*  K 3.8 4.3  CL 98 102  CO2 25 22  GLUCOSE 282* 290*  BUN 15 12  CREATININE 0.71 0.59*  CALCIUM 9.3 9.0   Liver Function Tests: Recent Labs  Lab 12/15/23 1246  AST 22  ALT 37  ALKPHOS 101  BILITOT 1.0  PROT 7.8  ALBUMIN 4.4   Recent Labs  Lab 12/15/23 1246  LIPASE 28   No results for input(s): AMMONIA in the last 168 hours. CBC: Recent Labs  Lab 12/15/23 1246  WBC 8.7  NEUTROABS 6.7  HGB 15.5  HCT 46.9  MCV 84.4  PLT 193   Cardiac Enzymes: No results for input(s): CKTOTAL, CKMB, CKMBINDEX,  TROPONINI in the last 168 hours.  BNP (last 3 results) No results for input(s): BNP in the last 8760 hours.  ProBNP (last 3 results) No results for input(s): PROBNP in the last 8760 hours.  CBG: Recent Labs  Lab 12/15/23 1712 12/15/23 1840 12/15/23 2152 12/16/23 0739  GLUCAP 222* 218* 353* 233*    Radiological Exams on Admission: CT ABDOMEN PELVIS W CONTRAST Result Date: 12/15/2023 CLINICAL DATA:  Perianal abscess.  Difficulty having bowel movement. EXAM: CT ABDOMEN AND PELVIS WITH CONTRAST TECHNIQUE: Multidetector  CT imaging of the abdomen and pelvis was performed using the standard protocol following bolus administration of intravenous contrast. RADIATION DOSE REDUCTION: This exam was performed according to the departmental dose-optimization program which includes automated exposure control, adjustment of the mA and/or kV according to patient size and/or use of iterative reconstruction technique. CONTRAST:  OMNIPAQUE  IOHEXOL  300 MG/ML  SOLN COMPARISON:  None available FINDINGS: Lower chest: Linear dependent atelectasis or scarring in the left base. No effusions. Hepatobiliary: No focal hepatic abnormality. Gallbladder unremarkable. Pancreas: No focal abnormality or ductal dilatation. Spleen: No focal abnormality.  Normal size. Adrenals/Urinary Tract: No adrenal abnormality. No focal renal abnormality. No stones or hydronephrosis. Urinary bladder is unremarkable. Stomach/Bowel: Normal appendix. Stomach, large and small bowel grossly unremarkable. Vascular/Lymphatic: No evidence of aneurysm or adenopathy. Reproductive: No visible focal abnormality. Other: No free fluid or free air. Fluid collections noted posterior and to the left of the anus/rectum compatible with perianal abscess. This measures approximately 4 x 2.5 cm. Musculoskeletal: No acute bony abnormality. IMPRESSION: 4 x 2.5 cm fluid collection noted posterior and to the left of the anus/rectum compatible with perianal abscess. Electronically Signed   By: Franky Crease M.D.   On: 12/15/2023 14:33    Summary and Recommendations: This is a previously healthy 43 year old Hispanic gentleman admitted to the surgical service for incision and drainage of perirectal abscess, found to have new diagnosis of poorly controlled diabetes with hemoglobin A1c 10.9.  Diagnosis and general management guidelines were discussed with the patient.  Inpatient diabetes program coordinator was consulted for education.  For now would recommend continuation of carb modified diet,  will start basal insulin  with sliding scale insulin  as needed.  Will plan to titrate as appropriate.  Thank you for involving us  in the care of your patient. [facility] will continue to follow along with you.    Code Status: Full Code  Time spent: 48 minutes  Donelle Hise CHRISTELLA Gail MD [facility] Pager [phone]  If 7PM-7AM, please contact night-coverage [email or web address] Password [facility]  12/16/2023, 8:34 AM

## 2023-12-16 NOTE — TOC Initial Note (Addendum)
 Transition of Care [facility]) - Initial/Assessment Note    Patient Details  Name: Dennis Black MRN: [id number] Date of Birth: 06-Jan-1981  Transition of Care [facility]) CM/SW Contact:    Sonda Manuella Quill, RN Phone Number: 12/16/2023, 12:22 PM  Clinical Narrative:                 Assisted by Dennis Black, Interpreter # [phone]; spoke w/ pt and spouse Dennis Black in room; pt says he lives at home w/ his wife; he plans to return at d/c; pt says he has transportation; he verified he does not have insurance/PCP; pt denied SDOH risks; pt says he does not have DME, HH services, or home oxygen; he agreed to receive resources for social services, and [facility]; pt will make his own appt w/ agencies of choice; he would also like to know the cost of his d/c medication; [facility] outpatient therapy notified via secure chat; resources placed in d/c instructions; per [facility] pharmacy RXs need to get price; Eulah Hammonds, PA and Dr Zella notified via secure chat; TOC is following.  -1405- per Dr Zella diabetic regimen will not be known until tomorrow at earliest; will pass on to oncoming [facility] for follow up; pt will be notified. Expected Discharge Plan: Home/Self Care Barriers to Discharge: Continued Medical Work up   Patient Goals and CMS Choice Patient states their goals for this hospitalization and ongoing recovery are:: home          Expected Discharge Plan and Services       Living arrangements for the past 2 months: Apartment                                      Prior Living Arrangements/Services Living arrangements for the past 2 months: Apartment Lives with:: Spouse Patient language and need for interpreter reviewed:: Yes Do you feel safe going back to the place where you live?: Yes      Need for Family Participation in Patient Care: Yes (Comment) Care giver support system in place?: Yes (comment)   Criminal Activity/Legal Involvement Pertinent to Current  Situation/Hospitalization: No - Comment as needed  Activities of Daily Living   ADL Screening (condition at time of admission) Independently performs ADLs?: Yes (appropriate for developmental age) Is the patient deaf or have difficulty hearing?: No Does the patient have difficulty seeing, even when wearing glasses/contacts?: No Does the patient have difficulty concentrating, remembering, or making decisions?: No  Permission Sought/Granted Permission sought to share information with : Case Manager Permission granted to share information with : Yes, Verbal Permission Granted  Share Information with NAME: Case Manager     Permission granted to share info w Relationship: Dennis Black (spouse) [phone]     Emotional Assessment Appearance:: Appears stated age Attitude/Demeanor/Rapport: Gracious Affect (typically observed): Accepting Orientation: : Oriented to Self, Oriented to Place, Oriented to  Time, Oriented to Situation Alcohol / Substance Use: Not Applicable Psych Involvement: No (comment)  Admission diagnosis:  Perianal abscess [K61.0] Perirectal abscess [K61.1] Patient Active Problem List   Diagnosis Date Noted   Perirectal abscess 12/15/2023   PCP:  Pcp, No Pharmacy:   [facility] - [place], [place] - [address] [place] [address] Phone: [phone] Fax: [phone]     Social Drivers of Health (SDOH) Social History: SDOH Screenings   Food Insecurity: No Food Insecurity (12/16/2023)  Housing:  Low Risk  (12/16/2023)  Transportation Needs: No Transportation Needs (12/16/2023)  Utilities: Not At Risk (12/16/2023)   SDOH Interventions: Food Insecurity Interventions: Intervention Not Indicated, Inpatient TOC Housing Interventions: Intervention Not Indicated, Inpatient TOC Transportation Interventions: Intervention Not Indicated, Inpatient TOC Utilities Interventions: Intervention Not Indicated, Inpatient  TOC   Readmission Risk Interventions     No data to display

## 2023-12-16 NOTE — Inpatient Diabetes Management (Signed)
 Inpatient Diabetes Program Recommendations  AACE/ADA: New Consensus Statement on Inpatient Glycemic Control (2015)  Target Ranges:  Prepandial:   less than 140 mg/dL      Peak postprandial:   less than 180 mg/dL (1-2 hours)      Critically ill patients:  140 - 180 mg/dL   Lab Results  Component Value Date   GLUCAP 232 (H) 12/16/2023   HGBA1C 10.9 (H) 12/15/2023    Review of Glycemic Control  Latest Reference Range & Units 12/15/23 17:12 12/15/23 18:40 12/15/23 21:52 12/16/23 07:39 12/16/23 11:58  Glucose-Capillary 70 - 99 mg/dL 777 (H) 781 (H) 646 (H) 233 (H) 232 (H)  (H): Data is abnormally high  Diabetes history: DM2 Outpatient Diabetes medications: None Current orders for Inpatient glycemic control: Semglee 10 units every day, Novolog  0-15 units TID    Discharge Recommendations:  Intermediate acting recommendations: insulin  aspart protamine - aspart (NOVOLOG  70/30) FlexPen 8 units BID  Supply/Referral recommendations: Pen needles - standard   Use Adult Diabetes Insulin  Treatment Post Discharge order set.  Met with patient at bedside using the [employer] Interpreter.  He has been told in the past that he has diabetes.  He does not take any DM medications.  Spoke with pt about new diagnosis. Discussed basic pathophysiology of DM Type 2, basic home care, basic diabetes diet nutrition principles, importance of checking CBGs and maintaining good CBG control to prevent long-term and short-term complications. Reviewed signs and symptoms of hyperglycemia and hypoglycemia and how to treat hypoglycemia at home. Also reviewed blood sugar goals at home.   Reviewed patient's current A1c of 10.9% (average BG of 266 mg/dL). Explained what a A1c is and what it measures. Also reviewed goal A1c with patient, importance of good glucose control @ home, and blood sugar goals.   Educated on The Plate Method, CHO's, portion control, avoiding caloric beverages, CBGs at home fasting and mid afternoon,  F/U with PCP every 3 months, bring meter to PCP office, long and short term complications of uncontrolled BG, and importance of exercise.  LWWDM booklet is at bedside and we reviewed page 27 which focuses on nutrition.    He will need affordable insulin  at discharge as he is uninsured.  Educated him on the [employer] Novolin ReliOn 70/30 insulin .  TOC may be able to provide a MATCH letter for Novolog  70/30 and when he runs out he can obtain more of the 70/30 ([employer] brand) at [employer].  A box of 5 insulin  pens is $43 and will last him a couple of months.    Educated patient on insulin  pen use at home. Reviewed contents of insulin  flexpen starter kit. Reviewed all steps of insulin  pen including attachment of needle, 2-unit air shot, dialing up dose, giving injection, removing needle, disposal of sharps, storage of unused insulin , disposal of insulin  etc. Patient able to provide successful return demonstration. Also reviewed troubleshooting with insulin  pen. MD to give patient Rxs for insulin  pens and insulin  pen needles.  Provided him with a ReliOn glucometer and educated him on how to use.  Asked him to check his BG QAM fasting and before bedtime.  If his CBG is < 100 mg/dL at bedtime he should have a small 15 gm carb snack.  Call PCP is CBGs consistently <100 mg/dL or > 799 mg/dL.    He verbalizes understanding and all questions answered.    Thank you, Wyvonna Pinal, MSN, CDCES Diabetes Coordinator Inpatient Diabetes Program [phone] (team pager from 8a-5p)

## 2023-12-16 NOTE — Plan of Care (Signed)
  Problem: Clinical Measurements: Goal: Diagnostic test results will improve Outcome: Progressing   Problem: Pain Managment: Goal: General experience of comfort will improve and/or be controlled Outcome: Progressing   Problem: Safety: Goal: Ability to remain free from injury will improve Outcome: Progressing   Problem: Skin Integrity: Goal: Risk for impaired skin integrity will decrease Outcome: Progressing

## 2023-12-16 NOTE — TOC Progression Note (Signed)
 Transition of Care [facility]) - Progression Note    Patient Details  Name: Dennis Black MRN: [id number] Date of Birth: 1980/11/21  Transition of Care [facility]) CM/SW Contact  Sonda Manuella Quill, RN Phone Number: 12/16/2023, 3:22 PM  Clinical Narrative:    Assisted by Beulah, Interpreter # [phone]; pt informed diabetes regimen not yet determined; also confirmed pharmacy [facility]; pt says she should be able to pay for medication, TOC is following.   Expected Discharge Plan: Home/Self Care Barriers to Discharge: Continued Medical Work up  Expected Discharge Plan and Services       Living arrangements for the past 2 months: Apartment                                       Social Determinants of Health (SDOH) Interventions SDOH Screenings   Food Insecurity: No Food Insecurity (12/16/2023)  Housing: Low Risk  (12/16/2023)  Transportation Needs: No Transportation Needs (12/16/2023)  Utilities: Not At Risk (12/16/2023)    Readmission Risk Interventions     No data to display

## 2023-12-16 NOTE — Progress Notes (Signed)
 1 Day Post-Op  Subjective: CC: Patient stated that his pain is much better today after the procedure. He states he does not have any subjective fever that he thought he had yesterday. He is tolerating his carb modified diet well. Patient wants to go home mainly because he has to go back to work.   Objective: Vital signs in last 24 hours: Temp:  [98.1 F (36.7 C)-99.7 F (37.6 C)] 98.3 F (36.8 C) (07/18 0543) Pulse Rate:  [59-94] 59 (07/18 0543) Resp:  [15-24] 16 (07/18 0543) BP: (114-145)/(61-95) 114/61 (07/18 0543) SpO2:  [92 %-100 %] 97 % (07/18 0543) Weight:  [94.1 kg] 94.1 kg (07/17 2146) Last BM Date : 12/15/23  Intake/Output from previous day: 07/17 0701 - 07/18 0700 In: 1478.4 [P.O.:840; I.V.:537.4; IV Piggyback:101] Out: -  Intake/Output this shift: No intake/output data recorded.  PE:  Constitutional:      General: He is not in acute distress. Cardiovascular:     Rate and Rhythm: Normal rate.  Pulmonary:     Effort: Pulmonary effort is normal.  Genitourinary:    Comments: Tenderness has improved today. There is no erythema or fluctuance noted on exam. Iodoform packing was removed with minimal bleeding. I&D site is clean and without signs of cellulitis.  Skin:    General: Skin is warm and dry.  Neurological:     Mental Status: He is alert and oriented to person, place, and time.   Lab Results:  Recent Labs    12/15/23 1246  WBC 8.7  HGB 15.5  HCT 46.9  PLT 193   BMET Recent Labs    12/15/23 1246 12/16/23 0329  NA 133* 133*  K 3.8 4.3  CL 98 102  CO2 25 22  GLUCOSE 282* 290*  BUN 15 12  CREATININE 0.71 0.59*  CALCIUM 9.3 9.0   PT/INR No results for input(s): LABPROT, INR in the last 72 hours. CMP     Component Value Date/Time   NA 133 (L) 12/16/2023 0329   K 4.3 12/16/2023 0329   CL 102 12/16/2023 0329   CO2 22 12/16/2023 0329   GLUCOSE 290 (H) 12/16/2023 0329   BUN 12 12/16/2023 0329   CREATININE 0.59 (L) 12/16/2023 0329    CALCIUM 9.0 12/16/2023 0329   PROT 7.8 12/15/2023 1246   ALBUMIN 4.4 12/15/2023 1246   AST 22 12/15/2023 1246   ALT 37 12/15/2023 1246   ALKPHOS 101 12/15/2023 1246   BILITOT 1.0 12/15/2023 1246   GFRNONAA >60 12/16/2023 0329   Lipase     Component Value Date/Time   LIPASE 28 12/15/2023 1246    Studies/Results: CT ABDOMEN PELVIS W CONTRAST Result Date: 12/15/2023 CLINICAL DATA:  Perianal abscess.  Difficulty having bowel movement. EXAM: CT ABDOMEN AND PELVIS WITH CONTRAST TECHNIQUE: Multidetector CT imaging of the abdomen and pelvis was performed using the standard protocol following bolus administration of intravenous contrast. RADIATION DOSE REDUCTION: This exam was performed according to the departmental dose-optimization program which includes automated exposure control, adjustment of the mA and/or kV according to patient size and/or use of iterative reconstruction technique. CONTRAST:  OMNIPAQUE  IOHEXOL  300 MG/ML  SOLN COMPARISON:  None available FINDINGS: Lower chest: Linear dependent atelectasis or scarring in the left base. No effusions. Hepatobiliary: No focal hepatic abnormality. Gallbladder unremarkable. Pancreas: No focal abnormality or ductal dilatation. Spleen: No focal abnormality.  Normal size. Adrenals/Urinary Tract: No adrenal abnormality. No focal renal abnormality. No stones or hydronephrosis. Urinary bladder is unremarkable. Stomach/Bowel: Normal appendix.  Stomach, large and small bowel grossly unremarkable. Vascular/Lymphatic: No evidence of aneurysm or adenopathy. Reproductive: No visible focal abnormality. Other: No free fluid or free air. Fluid collections noted posterior and to the left of the anus/rectum compatible with perianal abscess. This measures approximately 4 x 2.5 cm. Musculoskeletal: No acute bony abnormality. IMPRESSION: 4 x 2.5 cm fluid collection noted posterior and to the left of the anus/rectum compatible with perianal abscess. Electronically Signed    By: Franky Crease M.D.   On: 12/15/2023 14:33    Anti-infectives: Anti-infectives (From admission, onward)    Start     Dose/Rate Route Frequency Ordered Stop   12/15/23 1530  piperacillin -tazobactam (ZOSYN ) IVPB 3.375 g        3.375 g 12.5 mL/hr over 240 Minutes Intravenous Every 8 hours 12/15/23 1523          Assessment/Plan POD1- S/P INCISION AND DRAINAGE, ABSCESS, PERIRECTAL, 7/17, Dr. Debby   -Continue IV Abx -Pain management -DM management with assistance from diabetes coordinator and TRH -Sitz bath -Stool softener -Monitor CBC and glucose- Glucose 290 with A1C of 10.9, WBC 8.7 (7/17). -Possible discharge pending recommendations for diabetes management. He will have post op follow up in our office.   FEN - Carb modified  VTE - SCDs, Lovenox   ID - [email or web address]   -New onset T2DM - Semglee 15 units every day per diabetes coordinator's recommendation   I reviewed nursing notes, hospitalist notes, last 24 h vitals and pain scores, last 48 h intake and output, last 24 h labs and trends, and last 24 h imaging results.    LOS: 0 days    Eulah Hammonds, [facility] 12/16/2023, 9:23 AM Please see Amion for pager number during day hours 7:00am-4:30pm

## 2023-12-16 NOTE — Plan of Care (Signed)
  Problem: Coping: Goal: Ability to adjust to condition or change in health will improve Outcome: Progressing   Problem: Fluid Volume: Goal: Ability to maintain a balanced intake and output will improve Outcome: Progressing   Problem: Health Behavior/Discharge Planning: Goal: Ability to identify and utilize available resources and services will improve Outcome: Progressing Goal: Ability to manage health-related needs will improve Outcome: Progressing

## 2023-12-16 NOTE — Inpatient Diabetes Management (Signed)
 Inpatient Diabetes Program Recommendations  AACE/ADA: New Consensus Statement on Inpatient Glycemic Control (2015)  Target Ranges:  Prepandial:   less than 140 mg/dL      Peak postprandial:   less than 180 mg/dL (1-2 hours)      Critically ill patients:  140 - 180 mg/dL   Lab Results  Component Value Date   GLUCAP 233 (H) 12/16/2023   HGBA1C 10.9 (H) 12/15/2023   Review of Glycemic Control  Diabetes history: New onset DM2 Outpatient Diabetes medications: None Current orders for Inpatient glycemic control: Novolog  0-15 units Q4H  Inpatient Diabetes Program Recommendations:    Please consider:  Semglee 15 units every day  Will speak with patient today regarding new onset DM2.  Ordered the LWWDM booklet and insulin  starter kit.    Thank you, Dennis Pinal, MSN, CDCES Diabetes Coordinator Inpatient Diabetes Program [phone] (team pager from 8a-5p)

## 2023-12-16 NOTE — Discharge Instructions (Addendum)
 1.Mantenga la zona de la incisin limpia y seca. 2.Realice baos de asiento de 2 a 3 veces al da para facilitar el alivio y la cicatrizacin. 3.Seque la zona con palmaditas suaves despus de cada bao.  [facility]

## 2023-12-17 DIAGNOSIS — E1165 Type 2 diabetes mellitus with hyperglycemia: Secondary | ICD-10-CM

## 2023-12-17 LAB — GLUCOSE, CAPILLARY
Glucose-Capillary: 243 mg/dL — ABNORMAL HIGH (ref 70–99)
Glucose-Capillary: 218 mg/dL — ABNORMAL HIGH (ref 70–99)

## 2023-12-17 MED ORDER — LANCETS MISC: 1.0000 | Freq: Three times a day (TID) | 0 refills | Status: AC

## 2023-12-17 MED ORDER — PEN NEEDLES 31G X 5 MM MISC: 1.0000 | Freq: Three times a day (TID) | 0 refills | Status: AC

## 2023-12-17 MED ORDER — POLYETHYLENE GLYCOL 3350 17 G PO PACK
17.0000 g | PACK | Freq: Every day | ORAL | 0 refills | Status: AC | PRN
Start: 2023-12-17 — End: ?

## 2023-12-17 MED ORDER — BLOOD GLUCOSE TEST VI STRP: 1.0000 | ORAL_STRIP | Freq: Three times a day (TID) | 0 refills | Status: AC

## 2023-12-17 MED ORDER — OXYCODONE-ACETAMINOPHEN 5-325 MG PO TABS: 1.0000 | ORAL_TABLET | ORAL | 0 refills | Status: AC | PRN

## 2023-12-17 MED ORDER — NOVOLIN 70/30 FLEXPEN RELION (70-30) 100 UNIT/ML ~~LOC~~ SUPN: 10.0000 [IU] | PEN_INJECTOR | Freq: Two times a day (BID) | SUBCUTANEOUS | 2 refills | Status: AC

## 2023-12-17 MED ORDER — BLOOD GLUCOSE MONITORING SUPPL DEVI: 1.0000 | Freq: Three times a day (TID) | 0 refills | Status: AC

## 2023-12-17 MED ORDER — LANCET DEVICE MISC: 1.0000 | Freq: Three times a day (TID) | 0 refills | Status: AC

## 2023-12-17 NOTE — Hospital Course (Addendum)
 43 year old man no significant PMH presented with perirectal abscess.  Underwent incision and drainage 7/17 per general surgery.  Also found to have marked hyperglycemia.  Hemoglobin A1c 10.9, new diagnosis diabetes mellitus type 2 with hyperglycemia.  Seen by diabetes coordinator and started on insulin .  Educated on insulin  and willing to comply.  Tried hospitalist was consulted for management of new diagnosis of diabetes.  I have conferred with general surgery, recommended a course of insulin , discussed with patient and wife, and prescribed send insulin .  From my perspective he can be discharged home, he has been seen by inpatient care management with resources given to establish with primary care.  I have emphasized the need to follow-up as an outpatient.  Primary service General surgery

## 2023-12-17 NOTE — Progress Notes (Signed)
 2 Days Post-Op  Subjective: CC: Perianal pain much improved.  Working with medicine team on glucose levels.  Objective: Vital signs in last 24 hours: Temp:  [97.5 F (36.4 C)-98.3 F (36.8 C)] 97.5 F (36.4 C) (07/19 0601) Pulse Rate:  [54-61] 54 (07/19 0601) Resp:  [15-16] 15 (07/19 0601) BP: (100-113)/(54-70) 100/54 (07/19 0601) SpO2:  [96 %-98 %] 97 % (07/19 0601) Last BM Date : 12/16/23  Intake/Output from previous day: 07/18 0701 - 07/19 0700 In: 1370.1 [P.O.:720; I.V.:450.1; IV Piggyback:200] Out: -  Intake/Output this shift: Total I/O In: 267.3 [P.O.:240; IV Piggyback:27.3] Out: -   PE:  Constitutional:      General: He is not in acute distress. Cardiovascular:     Rate and Rhythm: Normal rate.  Pulmonary:     Effort: Pulmonary effort is normal.  Genitourinary:    Comments: pain improving Skin:    General: Skin is warm and dry.  Neurological:     Mental Status: He is alert and oriented to person, place, and time.   Lab Results:  Recent Labs    12/15/23 1246  WBC 8.7  HGB 15.5  HCT 46.9  PLT 193   BMET Recent Labs    12/15/23 1246 12/16/23 0329  NA 133* 133*  K 3.8 4.3  CL 98 102  CO2 25 22  GLUCOSE 282* 290*  BUN 15 12  CREATININE 0.71 0.59*  CALCIUM 9.3 9.0   PT/INR No results for input(s): LABPROT, INR in the last 72 hours. CMP     Component Value Date/Time   NA 133 (L) 12/16/2023 0329   K 4.3 12/16/2023 0329   CL 102 12/16/2023 0329   CO2 22 12/16/2023 0329   GLUCOSE 290 (H) 12/16/2023 0329   BUN 12 12/16/2023 0329   CREATININE 0.59 (L) 12/16/2023 0329   CALCIUM 9.0 12/16/2023 0329   PROT 7.8 12/15/2023 1246   ALBUMIN 4.4 12/15/2023 1246   AST 22 12/15/2023 1246   ALT 37 12/15/2023 1246   ALKPHOS 101 12/15/2023 1246   BILITOT 1.0 12/15/2023 1246   GFRNONAA >60 12/16/2023 0329   Lipase     Component Value Date/Time   LIPASE 28 12/15/2023 1246    Studies/Results: CT ABDOMEN PELVIS W CONTRAST Result Date:  12/15/2023 CLINICAL DATA:  Perianal abscess.  Difficulty having bowel movement. EXAM: CT ABDOMEN AND PELVIS WITH CONTRAST TECHNIQUE: Multidetector CT imaging of the abdomen and pelvis was performed using the standard protocol following bolus administration of intravenous contrast. RADIATION DOSE REDUCTION: This exam was performed according to the departmental dose-optimization program which includes automated exposure control, adjustment of the mA and/or kV according to patient size and/or use of iterative reconstruction technique. CONTRAST:  OMNIPAQUE  IOHEXOL  300 MG/ML  SOLN COMPARISON:  None available FINDINGS: Lower chest: Linear dependent atelectasis or scarring in the left base. No effusions. Hepatobiliary: No focal hepatic abnormality. Gallbladder unremarkable. Pancreas: No focal abnormality or ductal dilatation. Spleen: No focal abnormality.  Normal size. Adrenals/Urinary Tract: No adrenal abnormality. No focal renal abnormality. No stones or hydronephrosis. Urinary bladder is unremarkable. Stomach/Bowel: Normal appendix. Stomach, large and small bowel grossly unremarkable. Vascular/Lymphatic: No evidence of aneurysm or adenopathy. Reproductive: No visible focal abnormality. Other: No free fluid or free air. Fluid collections noted posterior and to the left of the anus/rectum compatible with perianal abscess. This measures approximately 4 x 2.5 cm. Musculoskeletal: No acute bony abnormality. IMPRESSION: 4 x 2.5 cm fluid collection noted posterior and to the left of the  anus/rectum compatible with perianal abscess. Electronically Signed   By: Franky Crease M.D.   On: 12/15/2023 14:33    Anti-infectives: Anti-infectives (From admission, onward)    Start     Dose/Rate Route Frequency Ordered Stop   12/16/23 0000  amoxicillin -clavulanate (AUGMENTIN ) 875-125 MG tablet        1 tablet Oral 2 times daily 12/16/23 1014     12/15/23 1530  piperacillin -tazobactam (ZOSYN ) IVPB 3.375 g        3.375 g 12.5  mL/hr over 240 Minutes Intravenous Every 8 hours 12/15/23 1523          Assessment/Plan POD2- S/P INCISION AND DRAINAGE, ABSCESS, PERIRECTAL, 7/17, Dr. Debby   -Four days of antibiotics -Pain management -DM management with assistance from diabetes coordinator and TRH -Sitz bath -Stool softener -Monitor CBC and glucose - Okay for discharge once glucose controlled  FEN - Carb modified  VTE - SCDs, Lovenox   ID - Zosyn   -New onset T2DM - Semglee  15 units every day per diabetes coordinator's recommendation   I reviewed nursing notes, hospitalist notes, last 24 h vitals and pain scores, last 48 h intake and output, last 24 h labs and trends, and last 24 h imaging results.    LOS: 1 day    Deward JINNY Foy, MD [facility] Surgery 12/17/2023, 11:19 AM Please see Amion for pager number during day hours 7:00am-4:30pm

## 2023-12-17 NOTE — Progress Notes (Addendum)
  Progress Note   Patient: Dennis Black FMW:[id number] DOB: 23-Sep-1980 DOA: 12/15/2023     1 DOS: the patient was seen and examined on 12/17/2023   Brief hospital course: 43 year old man no significant PMH presented with perirectal abscess.  Underwent incision and drainage 7/17 per general surgery.  Also found to have marked hyperglycemia.  Hemoglobin A1c 10.9, new diagnosis diabetes mellitus type 2 with hyperglycemia.  Seen by diabetes coordinator and started on insulin .  Educated on insulin  and willing to comply.  Tried hospitalist was consulted for management of new diagnosis of diabetes.  I have conferred with general surgery, recommended a course of insulin , discussed with patient and wife, and prescribed send insulin .  From my perspective he can be discharged home, he has been seen by inpatient care management with resources given to establish with primary care.  I have emphasized the need to follow-up as an outpatient.  Primary service General surgery  Perirectal abscess Management per general surgery  Diabetes mellitus type 2 with hyperglycemia, new diagnosis Hemoglobin A1c 10.9 Hyperglycemic but stable, seen by diabetic coordinator and educated, discharged on insulin , patient agreed to comply  I think the patient can be discharged home with outpatient follow-up on insulin .  I discussed my recommendations with Dr. Lyndel and provided prescription for insulin .    Subjective:  Feels better Willing to take insulin , understands how to  Physical Exam: Vitals:   12/16/23 0657 12/16/23 1513 12/16/23 2204 12/17/23 0601  BP:  113/70 (!) 108/54 (!) 100/54  Pulse:  61 (!) 54 (!) 54  Resp:  16 15 15   Temp:  98.3 F (36.8 C) 98.2 F (36.8 C) (!) 97.5 F (36.4 C)  TempSrc:  Oral Oral   SpO2:  96% 98% 97%  Weight:      Height: 5' 5 (1.651 m)      Physical Exam Vitals reviewed.  Constitutional:      General: He is not in acute distress.    Appearance: He is not ill-appearing  or toxic-appearing.  Cardiovascular:     Rate and Rhythm: Normal rate and regular rhythm.     Heart sounds: No murmur heard. Pulmonary:     Effort: Pulmonary effort is normal. No respiratory distress.     Breath sounds: No wheezing, rhonchi or rales.  Neurological:     Mental Status: He is alert.  Psychiatric:        Mood and Affect: Mood normal.        Behavior: Behavior normal.     Data Reviewed: CBG 200s primarily  Hgb 10.9  Family Communication: wife at bedside  Disposition: Status is: Inpatient      Time spent: 35 minutes  Author: Toribio Door, MD 12/17/2023 4:28 PM  For on call review [email or web address].

## 2023-12-17 NOTE — Inpatient Diabetes Management (Signed)
 Inpatient Diabetes Program Recommendations  AACE/ADA: New Consensus Statement on Inpatient Glycemic Control (2015)  Target Ranges:  Prepandial:   less than 140 mg/dL      Peak postprandial:   less than 180 mg/dL (1-2 hours)      Critically ill patients:  140 - 180 mg/dL   Lab Results  Component Value Date   GLUCAP 218 (H) 12/17/2023   HGBA1C 10.9 (H) 12/15/2023    Review of Glycemic Control  Latest Reference Range & Units 12/17/23 08:14 12/17/23 11:49  Glucose-Capillary 70 - 99 mg/dL 756 (H) 781 (H)    Diabetes history: New onset DM2 Outpatient Diabetes medications: None Current orders for Inpatient glycemic control: Novolog  0-15 units tid with meals, Semglee  10 units daily Inpatient Diabetes Program Recommendations:   Discharge Recommendations: Intermediate acting recommendations: insulin  isophane & regular (NOVOLIN  70/30 RELION PEN) ([facility] Only) 10 units bid  Supply/Referral recommendations: Pen needles - standard   Use Adult Diabetes Insulin  Treatment Post Discharge order set.  Randall Bullocks, RN, BC-ADM Inpatient Diabetes Coordinator Pager [phone]  (8a-5p)

## 2023-12-17 NOTE — Discharge Summary (Signed)
  Patient ID: Dennis Black [id number] 43 y.o. [birth year]  12/15/2023  Discharge date and time: 12/17/2023  Admitting Physician: Deward PARAS Kymani Laursen  Discharge Physician: Deward PARAS Lucetta Baehr  Admission Diagnoses: Perianal abscess [K61.0] Perirectal abscess [K61.1] Patient Active Problem List   Diagnosis Date Noted   Perirectal abscess 12/15/2023     Discharge Diagnoses:  Patient Active Problem List   Diagnosis Date Noted   Perirectal abscess 12/15/2023    Operations: Procedure(s): INCISION AND DRAINAGE, ABSCESS, PERIRECTAL  Admission Condition: good  Discharged Condition: good  Indication for Admission: Perianal abscess  Hospital Course: Perianal abscess drained.  HgA1c and glucoses elevated and patient was diagnosed with type two diabetes, started on insulin  by the medicine service and provided PCP follow up recommendations.  Consults: None  Significant Diagnostic Studies: CT, labwork  Treatments: surgery: as above  Disposition: Home  Patient Instructions:  Allergies as of 12/17/2023   No Known Allergies      Medication List     TAKE these medications    acetaminophen  500 MG tablet Commonly known as: TYLENOL  Take 1 tablet (500 mg total) by mouth every 6 (six) hours as needed. What changed:  how much to take reasons to take this   Advil 200 MG tablet Generic drug: ibuprofen Take 200-400 mg by mouth every 6 (six) hours as needed (for pain or headaches).   amoxicillin -clavulanate 875-125 MG tablet Commonly known as: AUGMENTIN  Take 1 tablet by mouth 2 (two) times daily.   Blood Glucose Monitoring Suppl Devi 1 each by Does not apply route 3 (three) times daily. May dispense any manufacturer covered by patient's insurance.   BLOOD GLUCOSE TEST STRIPS Strp 1 each by Does not apply route 3 (three) times daily. Use as directed to check blood sugar. May dispense any manufacturer covered by patient's insurance and fits patient's device.   Lancet Device  Misc 1 each by Does not apply route 3 (three) times daily. May dispense any manufacturer covered by patient's insurance.   Lancets Misc 1 each by Does not apply route 3 (three) times daily. Use as directed to check blood sugar. May dispense any manufacturer covered by patient's insurance and fits patient's device.   NovoLIN  70/30 Kwikpen (70-30) 100 UNIT/ML KwikPen Generic drug: insulin  isophane & regular human KwikPen Inject 10 Units into the skin 2 (two) times daily with a meal.   oxyCODONE -acetaminophen  5-325 MG tablet Commonly known as: Percocet Take 1 tablet by mouth every 4 (four) hours as needed for severe pain (pain score 7-10).   Pen Needles 31G X 5 MM Misc 1 each by Does not apply route 3 (three) times daily. May dispense any manufacturer covered by patient's insurance.   polyethylene glycol 17 g packet Commonly known as: MiraLax  Take 17 g by mouth daily as needed (Constipation - don't get constipated).        Activity: activity as tolerated Diet: regular diet Wound Care: none needed, epson salt baths to aid healing, don't get constipated.  Follow-up:  With [facility]  Signed: Deward PARAS [name] [facility] Bariatric, [facility] Minimally Invasive Surgery [facility], [place]   12/17/2023, 1:11 PM

## 2023-12-19 ENCOUNTER — Telehealth: Payer: Self-pay

## 2023-12-19 NOTE — Telephone Encounter (Signed)
 Copied from CRM [id number]. Topic: General - Other >> Dec 19, 2023 11:37 AM Charlet HERO wrote:  Reason for CRM: Patient is calling to get the orange card  to help with medical bills. Would like to have a call back for more assistance. Would like to be called at this number [phone] ask for [facility] patient does not speak english.
